# Patient Record
Sex: Female | Born: 2000 | Race: White | Hispanic: No | Smoking: Never smoker
Health system: Southern US, Community
[De-identification: ages and names within clinical notes are randomized; demographics above are authoritative.]

## PROBLEM LIST (undated history)

## (undated) ENCOUNTER — Inpatient Hospital Stay: Payer: Self-pay

## (undated) DIAGNOSIS — Z789 Other specified health status: Secondary | ICD-10-CM

## (undated) HISTORY — PX: NO PAST SURGERIES: SHX2092

---
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
## 2019-01-02 ENCOUNTER — Ambulatory Visit (INDEPENDENT_AMBULATORY_CARE_PROVIDER_SITE_OTHER): Payer: Medicaid Other | Admitting: Obstetrics and Gynecology

## 2019-01-02 VITALS — BP 136/81 | HR 111 | Wt 216.4 lb

## 2019-01-02 DIAGNOSIS — Z130 Encounter for screening for diseases of the blood and blood-forming organs and certain disorders involving the immune mechanism: Secondary | ICD-10-CM

## 2019-01-02 DIAGNOSIS — Z3402 Encounter for supervision of normal first pregnancy, second trimester: Secondary | ICD-10-CM | POA: Insufficient documentation

## 2019-01-02 DIAGNOSIS — Z131 Encounter for screening for diabetes mellitus: Secondary | ICD-10-CM

## 2019-01-02 LAB — POCT URINALYSIS DIPSTICK OB
Bilirubin, UA: NEGATIVE
Blood, UA: NEGATIVE
GLUCOSE, UA: NEGATIVE
Ketones, UA: NEGATIVE
Leukocytes, UA: NEGATIVE
Nitrite, UA: NEGATIVE
Urobilinogen, UA: 0.2 E.U./dL
pH, UA: 6 (ref 5.0–8.0)

## 2019-01-02 NOTE — Progress Notes (Signed)
ROB: Doing well, no complaints. Discussed birth place tours and classes. For 28 week labs next visit. RRTC in 4 weeks.

## 2019-01-02 NOTE — Progress Notes (Signed)
   ROB-PT stated that she is doing well no complaints.    

## 2019-01-19 ENCOUNTER — Other Ambulatory Visit: Payer: Self-pay

## 2019-01-19 ENCOUNTER — Observation Stay
Admission: EM | Admit: 2019-01-19 | Discharge: 2019-01-19 | Disposition: A | Discharge status: Home / Self Care | Payer: Medicaid Other | Attending: Obstetrics and Gynecology | Admitting: Obstetrics and Gynecology

## 2019-01-19 DIAGNOSIS — Z3A26 26 weeks gestation of pregnancy: Secondary | ICD-10-CM | POA: Diagnosis not present

## 2019-01-19 DIAGNOSIS — O368120 Decreased fetal movements, second trimester, not applicable or unspecified: Secondary | ICD-10-CM | POA: Diagnosis present

## 2019-01-19 DIAGNOSIS — O368190 Decreased fetal movements, unspecified trimester, not applicable or unspecified: Secondary | ICD-10-CM | POA: Diagnosis present

## 2019-01-19 NOTE — Discharge Instructions (Signed)

## 2019-01-19 NOTE — Progress Notes (Signed)
Pr here with c/o no fetal movement since lunchtime 01/18/19, has tried eating, drinking, position changes...denies bleeding, LOF, trauma, although she reports fall 2 weeks ago with no concerns after that event. As monitors applied, I felt fetal movement immediately, palpated 4-5 movements in the first 5 minutes with good FHTs.

## 2019-01-19 NOTE — Discharge Summary (Signed)
    L&D OB Triage Note  SUBJECTIVE Jamie Vincent is a 18 y.o. G1P0 female at 1942w3d, EDD Estimated Date of Delivery: 04/24/19 who presented to triage with complaints of decreased fetal movement.  Denies contractions, denies vaginal bleeding.  OB History  Gravida Para Term Preterm AB Living  1 0 0 0 0 0  SAB TAB Ectopic Multiple Live Births  0 0 0 0 0    # Outcome Date GA Lbr Len/2nd Weight Sex Delivery Anes PTL Lv  1 Current             Medications Prior to Admission  Medication Sig Dispense Refill Last Dose  . Prenatal Vit-Fe Fumarate-FA (MULTIVITAMIN-PRENATAL) 27-0.8 MG TABS tablet Take 1 tablet by mouth daily at 12 noon.   Taking     OBJECTIVE  Nursing Evaluation:   BP 135/69 (BP Location: Left Arm)   Pulse (!) 109   Temp 98.1 F (36.7 C) (Oral)   Resp 16   LMP 06/21/2018    Findings:   Nurse palpates fetal movement.  Patient acknowledges.  NST was performed and has been reviewed by me.  NST INTERPRETATION: Appropriate for gestational age  Mode: External Baseline Rate (A): 135 bpm Variability: Moderate Accelerations: 15 x 15 Decelerations: None, Variable     Contraction Frequency (min): none  ASSESSMENT Impression:  1.  Pregnancy:  G1P0 at 8542w3d , EDD Estimated Date of Delivery: 04/24/19 2.  NST:  Appropriate for gestational age  PLAN 1. Reassurance given 2. Discharge home with standard labor precautions given to return to L&D or call the office for problems. 3. Continue routine prenatal care.

## 2019-01-30 ENCOUNTER — Encounter: Payer: Self-pay | Admitting: Obstetrics and Gynecology

## 2019-01-30 ENCOUNTER — Other Ambulatory Visit: Payer: Medicaid Other

## 2019-01-30 ENCOUNTER — Ambulatory Visit (INDEPENDENT_AMBULATORY_CARE_PROVIDER_SITE_OTHER): Payer: Medicaid Other | Admitting: Obstetrics and Gynecology

## 2019-01-30 VITALS — BP 112/78 | HR 102 | Ht 63.0 in | Wt 217.4 lb

## 2019-01-30 DIAGNOSIS — Z23 Encounter for immunization: Secondary | ICD-10-CM | POA: Diagnosis not present

## 2019-01-30 DIAGNOSIS — Z3402 Encounter for supervision of normal first pregnancy, second trimester: Secondary | ICD-10-CM

## 2019-01-30 DIAGNOSIS — Z130 Encounter for screening for diseases of the blood and blood-forming organs and certain disorders involving the immune mechanism: Secondary | ICD-10-CM

## 2019-01-30 DIAGNOSIS — Z3401 Encounter for supervision of normal first pregnancy, first trimester: Secondary | ICD-10-CM

## 2019-01-30 DIAGNOSIS — Z131 Encounter for screening for diabetes mellitus: Secondary | ICD-10-CM

## 2019-01-30 LAB — POCT URINALYSIS DIPSTICK OB
Bilirubin, UA: NEGATIVE
Glucose, UA: NEGATIVE
Ketones, UA: NEGATIVE
Nitrite, UA: NEGATIVE
PH UA: 6.5 (ref 5.0–8.0)
RBC UA: NEGATIVE
SPEC GRAV UA: 1.02 (ref 1.010–1.025)
UROBILINOGEN UA: 0.2 U/dL

## 2019-01-30 NOTE — Progress Notes (Signed)
Patient comes in today for ROB visit. She is having CBC,RPR, and glucose test today. Tdap and blood transfusion form signed.

## 2019-01-30 NOTE — Progress Notes (Signed)
ROB: No complaints.  Reports active daily movement.  1 hour GCT today.

## 2019-01-31 LAB — CBC
Hematocrit: 30.4 % — ABNORMAL LOW (ref 34.0–46.6)
Hemoglobin: 10.3 g/dL — ABNORMAL LOW (ref 11.1–15.9)
MCH: 26.5 pg — AB (ref 26.6–33.0)
MCHC: 33.9 g/dL (ref 31.5–35.7)
MCV: 78 fL — AB (ref 79–97)
Platelets: 339 10*3/uL (ref 150–450)
RBC: 3.88 x10E6/uL (ref 3.77–5.28)
RDW: 14 % (ref 11.7–15.4)
WBC: 11.9 10*3/uL — ABNORMAL HIGH (ref 3.4–10.8)

## 2019-01-31 LAB — RPR: RPR Ser Ql: NONREACTIVE

## 2019-01-31 LAB — ANTIBODY SCREEN: Antibody Screen: NEGATIVE

## 2019-01-31 LAB — GLUCOSE, 1 HOUR GESTATIONAL: Gestational Diabetes Screen: 139 mg/dL (ref 65–139)

## 2019-02-21 NOTE — Patient Instructions (Addendum)
Third Trimester of Pregnancy  The third trimester is from week 28 through week 40 (months 7 through 9). This trimester is when your unborn baby (fetus) is growing very fast. At the end of the ninth month, the unborn baby is about 20 inches in length. It weighs about 6-10 pounds. Follow these instructions at home: Medicines  Take over-the-counter and prescription medicines only as told by your doctor. Some medicines are safe and some medicines are not safe during pregnancy.  Take a prenatal vitamin that contains at least 600 micrograms (mcg) of folic acid.  If you have trouble pooping (constipation), take medicine that will make your stool soft (stool softener) if your doctor approves. Eating and drinking   Eat regular, healthy meals.  Avoid raw meat and uncooked cheese.  If you get low calcium from the food you eat, talk to your doctor about taking a daily calcium supplement.  Eat four or five small meals rather than three large meals a day.  Avoid foods that are high in fat and sugars, such as fried and sweet foods.  To prevent constipation: ? Eat foods that are high in fiber, like fresh fruits and vegetables, whole grains, and beans. ? Drink enough fluids to keep your pee (urine) clear or pale yellow. Activity  Exercise only as told by your doctor. Stop exercising if you start to have cramps.  Avoid heavy lifting, wear low heels, and sit up straight.  Do not exercise if it is too hot, too humid, or if you are in a place of great height (high altitude).  You may continue to have sex unless your doctor tells you not to. Relieving pain and discomfort  Wear a good support bra if your breasts are tender.  Take frequent breaks and rest with your legs raised if you have leg cramps or low back pain.  Take warm water baths (sitz baths) to soothe pain or discomfort caused by hemorrhoids. Use hemorrhoid cream if your doctor approves.  If you develop puffy, bulging veins (varicose  veins) in your legs: ? Wear support hose or compression stockings as told by your doctor. ? Raise (elevate) your feet for 15 minutes, 3-4 times a day. ? Limit salt in your food. Safety  Wear your seat belt when driving.  Make a list of emergency phone numbers, including numbers for family, friends, the hospital, and police and fire departments. Preparing for your baby's arrival To prepare for the arrival of your baby:  Take prenatal classes.  Practice driving to the hospital.  Visit the hospital and tour the maternity area.  Talk to your work about taking leave once the baby comes.  Pack your hospital bag.  Prepare the baby's room.  Go to your doctor visits.  Buy a rear-facing car seat. Learn how to install it in your car. General instructions  Do not use hot tubs, steam rooms, or saunas.  Do not use any products that contain nicotine or tobacco, such as cigarettes and e-cigarettes. If you need help quitting, ask your doctor.  Do not drink alcohol.  Do not douche or use tampons or scented sanitary pads.  Do not cross your legs for long periods of time.  Do not travel for long distances unless you must. Only do so if your doctor says it is okay.  Visit your dentist if you have not gone during your pregnancy. Use a soft toothbrush to brush your teeth. Be gentle when you floss.  Avoid cat litter boxes and soil  used by cats. These carry germs that can cause birth defects in the baby and can cause a loss of your baby (miscarriage) or stillbirth.  Keep all your prenatal visits as told by your doctor. This is important. Contact a doctor if:  You are not sure if you are in labor or if your water has broken.  You are dizzy.  You have mild cramps or pressure in your lower belly.  You have a nagging pain in your belly area.  You continue to feel sick to your stomach, you throw up, or you have watery poop.  You have bad smelling fluid coming from your vagina.  You have  pain when you pee. Get help right away if:  You have a fever.  You are leaking fluid from your vagina.  You are spotting or bleeding from your vagina.  You have severe belly cramps or pain.  You lose or gain weight quickly.  You have trouble catching your breath and have chest pain.  You notice sudden or extreme puffiness (swelling) of your face, hands, ankles, feet, or legs.  You have not felt the baby move in over an hour.  You have severe headaches that do not go away with medicine.  You have trouble seeing.  You are leaking, or you are having a gush of fluid, from your vagina before you are 37 weeks.  You have regular belly spasms (contractions) before you are 37 weeks. Summary  The third trimester is from week 28 through week 40 (months 7 through 9). This time is when your unborn baby is growing very fast.  Follow your doctor's advice about medicine, food, and activity.  Get ready for the arrival of your baby by taking prenatal classes, getting all the baby items ready, preparing the baby's room, and visiting your doctor to be checked.  Get help right away if you are bleeding from your vagina, or you have chest pain and trouble catching your breath, or if you have not felt your baby move in over an hour. This information is not intended to replace advice given to you by your health care provider. Make sure you discuss any questions you have with your health care provider. Document Released: 03/01/2010 Document Revised: 01/10/2017 Document Reviewed: 01/10/2017 Elsevier Interactive Patient Education  2019 [employer].   Common Medications Safe in Pregnancy  Acne:      Constipation:  Benzoyl Peroxide     Colace  Clindamycin      Dulcolax Suppository  Topica Erythromycin     Fibercon  Salicylic Acid      Metamucil         Miralax AVOID:        Senakot   Accutane    Cough:  Retin-A       Cough Drops  Tetracycline      Phenergan w/ Codeine if  Rx  Minocycline      Robitussin (Plain & DM)  Antibiotics:     Crabs/Lice:  Ceclor       RID  Cephalosporins    AVOID:  E-Mycins      Kwell  Keflex  Macrobid/Macrodantin   Diarrhea:  Penicillin      Kao-Pectate  Zithromax      Imodium AD         PUSH FLUIDS AVOID:       Cipro     Fever:  Tetracycline      Tylenol (Regular or Extra  Minocycline  Strength)  Levaquin      Extra Strength-Do not          Exceed 8 tabs/24 hrs Caffeine:        '200mg'$ /day (equiv. To 1 cup of coffee or  approx. 3 12 oz sodas)         Gas: Cold/Hayfever:       Gas-X  Benadryl      Mylicon  Claritin       Phazyme  **Claritin-D        Chlor-Trimeton    Headaches:  Dimetapp      ASA-Free Excedrin  Drixoral-Non-Drowsy     Cold Compress  Mucinex (Guaifenasin)     Tylenol (Regular or Extra  Sudafed/Sudafed-12 Hour     Strength)  **Sudafed PE Pseudoephedrine   Tylenol Cold & Sinus     Vicks Vapor Rub  Zyrtec  **AVOID if Problems With Blood Pressure         Heartburn: Avoid lying down for at least 1 hour after meals  Aciphex      Maalox     Rash:  Milk of Magnesia     Benadryl    Mylanta       1% Hydrocortisone Cream  Pepcid  Pepcid Complete   Sleep Aids:  Prevacid      Ambien   Prilosec       Benadryl  Rolaids       Chamomile Tea  Tums (Limit 4/day)     Unisom  Zantac       Tylenol PM         Warm milk-add vanilla or  Hemorrhoids:       Sugar for taste  Anusol/Anusol H.C.  (RX: Analapram 2.5%)  Sugar Substitutes:  Hydrocortisone OTC     Ok in moderation  Preparation H      Tucks        Vaseline lotion applied to tissue with wiping    Herpes:     Throat:  Acyclovir      Oragel  Famvir  Valtrex     Vaccines:         Flu Shot Leg Cramps:       *Gardasil  Benadryl      Hepatitis A         Hepatitis B Nasal Spray:       Pneumovax  Saline Nasal Spray     Polio Booster         Tetanus Nausea:       Tuberculosis test or PPD  Vitamin B6 25 mg  TID   AVOID:    Dramamine      *Gardasil  Emetrol       Live Poliovirus  Ginger Root 250 mg QID    MMR (measles, mumps &  High Complex Carbs @ Bedtime    rebella)  Sea Bands-Accupressure    Varicella (Chickenpox)  Unisom 1/2 tab TID     *No known complications           If received before Pain:         Known pregnancy;   Darvocet       Resume series after  Lortab        Delivery  Percocet    Yeast:   Tramadol      Femstat  Tylenol 3      Gyne-lotrimin  Ultram       Monistat  Vicodin           MISC:  All Sunscreens           Hair Coloring/highlights          Insect Repellant's          (Including DEET)         Mystic Tans   Pain Relief During Labor and Delivery Many things can cause pain during labor and delivery, including:  Pressure on bones and ligaments due to the baby moving through the pelvis.  Stretching of tissues due to the baby moving through the birth canal.  Muscle tension due to anxiety or nervousness.  The uterus tightening (contracting) and relaxing to help move the baby. There are many ways to deal with the pain of labor and delivery. They include:  Taking prenatal classes. Taking these classes helps you know what to expect during your baby's birth. What you learn will increase your confidence and decrease your anxiety.  Practicing relaxation techniques or doing relaxing activities, such as: ? Focused breathing. ? Meditation. ? Visualization. ? Aroma therapy. ? Listening to your favorite music. ? Hypnosis.  Taking a warm shower or bath (hydrotherapy). This may: ? Provide comfort and relaxation. ? Lessen your perception of pain. ? Decrease the amount of pain medicine needed. ? Decrease the length of labor.  Getting a massage or counterpressure on your back.  Applying warm packs or ice packs.  Changing positions often, moving around, or using a birthing ball.  Getting: ? Pain medicine through an IV or injection into a muscle. ? Pain  medicine inserted into your spinal column. ? Injections of sterile water just under the skin on your lower back (intradermal injections). ? Laughing gas (nitrous oxide). Discuss your pain control options with your health care provider during your prenatal visits. Explore the options offered by your hospital or birth center. What kinds of medicine are available? There are two kinds of medicines that can be used to relieve pain during labor and delivery:  Analgesics. These medicines decrease pain without causing you to lose feeling or the ability to move your muscles.  Anesthetics. These medicines block feeling in the body and can decrease your ability to move freely. Both of these kinds of medicine can cause minor side effects, such as nausea, trouble concentrating, and sleepiness. They can also decrease the baby's heart rate before birth and affect the baby's breathing rate after birth. For this reason, health care providers are careful about when and how much medicine is given. What are specific medicines and procedures that provide pain relief? Local Anesthetics Local anesthetics are used to numb a small area of the body. They may be used along with another kind of anesthetic or used to numb the nerves of the vagina, cervix, and perineum during the second stage of labor. General Anesthetics General anesthetics cause you to lose consciousness so you do not feel pain. They are usually only used for an emergency cesarean delivery. General anesthetics are given through an IV tube and a mask. Pudendal Block A pudendal block is a form of local anesthetic. It may be used to relieve the pain associated with pushing or stretching of the perineum at the time of delivery or to further numb the perineum. A pudendal block is done by injecting numbing medicine through the vaginal wall into a nerve in the pelvis. Epidural Analgesia Epidural analgesia is given through a flexible IV catheter that is inserted  into the lower back. Numbing medicine is delivered continuously to the area near your spinal column nerves (epidural space).  After having this type of analgesia, you may be able to move your legs but you most likely will not be able to walk. Depending on the amount of medicine given, you may lose all feeling in the lower half of your body, or you may retain some level of sensation, including the urge to push. Epidural analgesia can be used to provide pain relief for a vaginal birth. Spinal Block A spinal block is similar to epidural analgesia, but the medicine is injected into the spinal fluid instead of the epidural space. A spinal block is only given once. It starts to relieve pain quickly, but the pain relief lasts only 1-6 hours. Spinal blocks can be used for cesarean deliveries. Combined Spinal-Epidural (CSE) Block A CSE block combines the effects of a spinal block and epidural analgesia. The spinal block works quickly to block all pain. The epidural analgesia provides continuous pain relief, even after the effects of the spinal block have worn off. This information is not intended to replace advice given to you by your health care provider. Make sure you discuss any questions you have with your health care provider. Document Released: 03/23/2009 Document Revised: 05/13/2016 Document Reviewed: 04/27/2016 Elsevier Interactive Patient Education  2019 [employer].

## 2019-02-22 ENCOUNTER — Encounter: Payer: Self-pay | Admitting: Obstetrics and Gynecology

## 2019-02-22 ENCOUNTER — Ambulatory Visit (INDEPENDENT_AMBULATORY_CARE_PROVIDER_SITE_OTHER): Payer: Medicaid Other | Admitting: Obstetrics and Gynecology

## 2019-02-22 VITALS — BP 139/80 | HR 106 | Wt 219.8 lb

## 2019-02-22 DIAGNOSIS — Z3403 Encounter for supervision of normal first pregnancy, third trimester: Secondary | ICD-10-CM

## 2019-02-22 LAB — POCT URINALYSIS DIPSTICK OB
BILIRUBIN UA: NEGATIVE
Blood, UA: NEGATIVE
Glucose, UA: NEGATIVE
Ketones, UA: NEGATIVE
Leukocytes, UA: NEGATIVE
Nitrite, UA: NEGATIVE
POC,PROTEIN,UA: NEGATIVE
Spec Grav, UA: 1.01 (ref 1.010–1.025)
Urobilinogen, UA: 0.2 E.U./dL
pH, UA: 7.5 (ref 5.0–8.0)

## 2019-02-22 NOTE — Progress Notes (Signed)
ROB-Pt present for prenatal care. Pt stated that she was doing well no complaints.

## 2019-02-22 NOTE — Progress Notes (Signed)
ROB: Doing well, no complaints. Discussed pain management in labor. Given handout on birthplace classes and doula program.  RTC in 2 weeks.

## 2019-03-12 ENCOUNTER — Telehealth: Payer: Self-pay | Admitting: Surgical

## 2019-03-12 NOTE — Telephone Encounter (Signed)
Coronavirus (COVID-19) Are you at risk?  Are you at risk for the Coronavirus (COVID-19)?  To be considered HIGH RISK for Coronavirus (COVID-19), you have to meet the following criteria:  . Traveled to [place], [place], [place], [place] or [place]; or in the [place] to [place], [place], [place], or [place]; and have fever, cough, and shortness of breath within the last 2 weeks of travel OR . Been in close contact with a person diagnosed with COVID-19 within the last 2 weeks and have fever, cough, and shortness of breath . IF YOU DO NOT MEET THESE CRITERIA, YOU ARE CONSIDERED LOW RISK FOR COVID-19.  What to do if you are HIGH RISK for COVID-19?  . If you are having a medical emergency, call 911. . Seek medical care right away. Before you go to a doctor's office, urgent care or emergency department, call ahead and tell them about your recent travel, contact with someone diagnosed with COVID-19, and your symptoms. You should receive instructions from your physician's office regarding next steps of care.  . When you arrive at healthcare provider, tell the healthcare staff immediately you have returned from visiting [place], [place], [place], [place] or [place]; or traveled in the [place] to [place], [place], [place], or [place]; in the last two weeks or you have been in close contact with a person diagnosed with COVID-19 in the last 2 weeks.   . Tell the health care staff about your symptoms: fever, cough and shortness of breath. . After you have been seen by a medical provider, you will be either: o Tested for (COVID-19) and discharged home on quarantine except to seek medical care if symptoms worsen, and asked to  - Stay home and avoid contact with others until you get your results (4-5 days)  - Avoid travel on public transportation if possible (such as bus, train, or airplane) or o Sent to the Emergency Department by EMS for evaluation, COVID-19 testing, and possible  admission depending on your condition and test results.  What to do if you are LOW RISK for COVID-19?  Reduce your risk of any infection by using the same precautions used for avoiding the common cold or flu:  . Wash your hands often with soap and warm water for at least 20 seconds.  If soap and water are not readily available, use an alcohol-based hand sanitizer with at least 60% alcohol.  . If coughing or sneezing, cover your mouth and nose by coughing or sneezing into the elbow areas of your shirt or coat, into a tissue or into your sleeve (not your hands). . Avoid shaking hands with others and consider head nods or verbal greetings only. . Avoid touching your eyes, nose, or mouth with unwashed hands.  . Avoid close contact with people who are sick. . Avoid places or events with large numbers of people in one location, like concerts or sporting events. . Carefully consider travel plans you have or are making. . If you are planning any travel outside or inside the [place], visit the CDC's Travelers' Health webpage for the latest health notices. . If you have some symptoms but not all symptoms, continue to monitor at home and seek medical attention if your symptoms worsen. . If you are having a medical emergency, call 911.   ADDITIONAL HEALTHCARE OPTIONS FOR PATIENTS  [facility] / e-Visit: [email or web address]         [facility]: [phone]  [facility]: [phone]                   [facility]: [phone]    Patient has been prescreened. JW   

## 2019-03-13 ENCOUNTER — Encounter: Payer: Self-pay | Admitting: Obstetrics and Gynecology

## 2019-03-13 ENCOUNTER — Ambulatory Visit (INDEPENDENT_AMBULATORY_CARE_PROVIDER_SITE_OTHER): Payer: Medicaid Other | Admitting: Obstetrics and Gynecology

## 2019-03-13 ENCOUNTER — Other Ambulatory Visit: Payer: Self-pay

## 2019-03-13 VITALS — BP 124/79 | HR 99 | Ht 63.0 in | Wt 219.0 lb

## 2019-03-13 DIAGNOSIS — Z3403 Encounter for supervision of normal first pregnancy, third trimester: Secondary | ICD-10-CM

## 2019-03-13 LAB — POCT URINALYSIS DIPSTICK OB
Bilirubin, UA: NEGATIVE
Blood, UA: NEGATIVE
Glucose, UA: NEGATIVE
Ketones, UA: NEGATIVE
Nitrite, UA: NEGATIVE
Spec Grav, UA: 1.01 (ref 1.010–1.025)
Urobilinogen, UA: 0.2 E.U./dL
pH, UA: 6.5 (ref 5.0–8.0)

## 2019-03-13 NOTE — Progress Notes (Signed)
Patient comes in today for ROB visit. Patients states that she had some back pain last night off and on through the night. It is better today.

## 2019-03-13 NOTE — Progress Notes (Signed)
ROB: No complaints.  Patient still working at a daycare-advised to stop working.  COVID-19 discussed in detail.  Cultures next visit.

## 2019-03-13 NOTE — Patient Instructions (Signed)
Q: Why are visitor restrictions different for maternity care areas? [place] is restricting visitors for the duration of the patient's hospitalization. The birth of a child involves the mother, considered the patient, and a birthing partner. These are unprecedented times and we are making the exception to allow a birthing partner to be a part of the patient unit. No other guests will be allowed in our [facility] at [facility] and at [facility].  Q: Are credentialed doulas allowed to support their existing patients? We acknowledge the value these doula partnerships offer our care teams and many birthing families in our communities. Each laboring mother is allowed one birthing partner of the patient's choosing for her entire hospitalization.  Q: Are visitor restrictions different for hospitalized children? Pediatric patients (infants and children under 17 years of age), such as those in the Children's Unit, Pediatric ICU and NICU, will be allowed two visitors (parents or legal guardians)  Q: Are pregnant women at an increased risk for COVID-19? The American College of Obstetricians and Gynecologists (ACOG) is monitoring closely the coronavirus pandemic. With the limited information available, data does not indicate pregnant women are at an increased risk. However, pregnant women are known to be at greater risk for respiratory infections like flu. With that in mind, expectant mothers are considered an at-risk population for COVID-19, according to ACOG.  Q: Are newborns at an increased risk for COVID-19? A limited sample of COVID-19 data with newborns indicates the virus is not transferred to the infant during pregnancy. However, postpartum separation is recommended by the Centers for Disease Control (CDC). As a result [facility] recommends and strongly encourages temporary separation of moms and babies who test positive for COVID-19 or are awaiting results to rule out  COVID-19 based on CDC guidelines.  Q: If you have a suspected case of COVID-19, is the NICU couplet care room an option? No. If either patient is considered at-risk for having COVID-19, the [facility] Center at [facility] will not use the NICU couplet care rooms for that family.  Q: [place] is urging that elective procedures be postponed. What is considered elective for women's and children's service line? NOT ELECTIVE: Obstetric procedures, even those with an element of choice on timing, are not considered elective. Circumcisions are considered elective procedures, however, these do not deplete blood products and other resources, which is the spirit in which the COVID-19 postponement of elective procedures was intended. Therefore, circumcisions will be allowed.  ELECTIVE: Postpartum tubal ligations are considered elective and should be postponed.  [facility] supports as much as possible the medical care team working with the patient's individual needs to address timing during these unprecedented times. We seek the support of our medical care team in preserving needed resources throughout our crisis response to COVID-19.  Q: How does COVID-19 impact breastfeeding? Breastmilk is safe for your baby - even if the mother has tested positive for COVID-19. We suggest the mother pump her milk and have a healthy family member feed the baby to protect the baby from getting the virus.  If a COVID-19+ mother decides to breastfeed after discharge, we suggest proper protective equipment be worn and hand hygiene be performed before and after feeding the infant.  Q: Should we urge patients to avoid baby showers and large gatherings? Yes. As has been recommended for all citizens in our communities, gatherings of 10 or more should be avoided - pregnant or not. Seek creative options   for "hosting" baby showers through electronic means that honor the request for social distancing during this  time of heightened awareness.  Q: Should patients miss their prenatal appointments? No. Prenatal visits are NOT elective. While we want to limit contact and exposure, prenatal care is vital right now. Contact your physician's office if you have concerns about your visits. We are limiting outpatient office visits to the patient and one guest in order to reduce the potential for exposure.  Q: What if a pregnant woman feels sick? Should she miss her prenatal visit then? A pregnant woman experiencing coronavirus-like symptoms (i.e., cough, fever, difficulty breathing, shortness of breath, gastrointestinal issues) should contact her pregnancy care provider by phone. Her medical professional can best determine whether she should use a video visit or possibly go to a collection site to be tested for COVID-19. Contacting her primary care provider or her pregnancy care provider is her first step.  Q: What can I do about childbirth education? All the classes are cancelled. The [facility] Centers will offer online learning to support mothers on their journey.  We currently offer Understanding Childbirth, Understanding Breastfeeding and Understanding Newborn Care as an online class.  Please visit our website, [email or web address], to register for an online class.  Q: How can I keep from getting COVID-19? Together, we can reduce the risk of exposure to the virus and help you and your family remain healthy and safe. One of the best ways to protect yourself is to wash your hands frequently using soap and water. Also, you should avoid touching your eyes, nose and mouth with unwashed hands, avoid physical contact with others and practice social distancing.   

## 2019-03-27 ENCOUNTER — Telehealth: Payer: Self-pay | Admitting: Surgical

## 2019-03-27 NOTE — Telephone Encounter (Signed)
Coronavirus (COVID-19) Are you at risk?  Are you at risk for the Coronavirus (COVID-19)?  To be considered HIGH RISK for Coronavirus (COVID-19), you have to meet the following criteria:  . Traveled to [place], [place], [place], [place] or [place]; or in the [place] to [place], [place], [place], or [place]; and have fever, cough, and shortness of breath within the last 2 weeks of travel OR . Been in close contact with a person diagnosed with COVID-19 within the last 2 weeks and have fever, cough, and shortness of breath . IF YOU DO NOT MEET THESE CRITERIA, YOU ARE CONSIDERED LOW RISK FOR COVID-19.  What to do if you are HIGH RISK for COVID-19?  . If you are having a medical emergency, call 911. . Seek medical care right away. Before you go to a doctor's office, urgent care or emergency department, call ahead and tell them about your recent travel, contact with someone diagnosed with COVID-19, and your symptoms. You should receive instructions from your physician's office regarding next steps of care.  . When you arrive at healthcare provider, tell the healthcare staff immediately you have returned from visiting [place], [place], [place], [place] or [place]; or traveled in the [place] to [place], [place], [place], or [place]; in the last two weeks or you have been in close contact with a person diagnosed with COVID-19 in the last 2 weeks.   . Tell the health care staff about your symptoms: fever, cough and shortness of breath. . After you have been seen by a medical provider, you will be either: o Tested for (COVID-19) and discharged home on quarantine except to seek medical care if symptoms worsen, and asked to  - Stay home and avoid contact with others until you get your results (4-5 days)  - Avoid travel on public transportation if possible (such as bus, train, or airplane) or o Sent to the Emergency Department by EMS for evaluation, COVID-19 testing, and possible  admission depending on your condition and test results.  What to do if you are LOW RISK for COVID-19?  Reduce your risk of any infection by using the same precautions used for avoiding the common cold or flu:  . Wash your hands often with soap and warm water for at least 20 seconds.  If soap and water are not readily available, use an alcohol-based hand sanitizer with at least 60% alcohol.  . If coughing or sneezing, cover your mouth and nose by coughing or sneezing into the elbow areas of your shirt or coat, into a tissue or into your sleeve (not your hands). . Avoid shaking hands with others and consider head nods or verbal greetings only. . Avoid touching your eyes, nose, or mouth with unwashed hands.  . Avoid close contact with people who are sick. . Avoid places or events with large numbers of people in one location, like concerts or sporting events. . Carefully consider travel plans you have or are making. . If you are planning any travel outside or inside the [place], visit the CDC's Travelers' Health webpage for the latest health notices. . If you have some symptoms but not all symptoms, continue to monitor at home and seek medical attention if your symptoms worsen. . If you are having a medical emergency, call 911.   ADDITIONAL HEALTHCARE OPTIONS FOR PATIENTS  [facility] / e-Visit: [email or web address]         [facility]: [phone]  [facility]: [phone]                   [facility]: [phone]    Patient has been screened with no symptoms. JW 

## 2019-03-28 ENCOUNTER — Encounter: Payer: Self-pay | Admitting: Obstetrics and Gynecology

## 2019-03-28 ENCOUNTER — Other Ambulatory Visit: Payer: Self-pay

## 2019-03-28 ENCOUNTER — Ambulatory Visit (INDEPENDENT_AMBULATORY_CARE_PROVIDER_SITE_OTHER): Payer: Medicaid Other | Admitting: Obstetrics and Gynecology

## 2019-03-28 VITALS — BP 112/76 | HR 103 | Wt 221.4 lb

## 2019-03-28 DIAGNOSIS — O479 False labor, unspecified: Secondary | ICD-10-CM

## 2019-03-28 DIAGNOSIS — Z3403 Encounter for supervision of normal first pregnancy, third trimester: Secondary | ICD-10-CM

## 2019-03-28 LAB — POCT URINALYSIS DIPSTICK OB
Bilirubin, UA: NEGATIVE
Blood, UA: NEGATIVE
Glucose, UA: NEGATIVE
Ketones, UA: NEGATIVE
Nitrite, UA: NEGATIVE
POC,PROTEIN,UA: NEGATIVE
Spec Grav, UA: 1.015 (ref 1.010–1.025)
Urobilinogen, UA: 0.2 E.U./dL
pH, UA: 6.5 (ref 5.0–8.0)

## 2019-03-28 NOTE — Progress Notes (Signed)
ROB: Patient doing well, no complaints. Does note some [employer].  Discussed labor precautions. Informed of virtual breastfeeding classes, patient willing to participate. 36 week labs done today. Bedside scan for presentation done, vertex. RTC in 2 weeks.

## 2019-03-28 NOTE — Patient Instructions (Signed)

## 2019-03-28 NOTE — Progress Notes (Signed)
ROB-Pt present today for prenatal care and 36 weeks cultures. Pt stated that she was doing well. Pt stated Fetal movement present, light pressure in the abd area. Pt stated braxton hicks contractions off and on and no vaginal bleeding.

## 2019-03-30 LAB — STREP GP B NAA: Strep Gp B NAA: NEGATIVE

## 2019-03-31 LAB — GC/CHLAMYDIA PROBE AMP
Chlamydia trachomatis, NAA: NEGATIVE
Neisseria Gonorrhoeae by PCR: NEGATIVE

## 2019-04-09 ENCOUNTER — Telehealth: Payer: Self-pay

## 2019-04-09 NOTE — Telephone Encounter (Signed)
Pt called to do prescreening for COVID-19 before her visit no answer LM via voicemail to call the office to do prescreening.

## 2019-04-10 ENCOUNTER — Ambulatory Visit (INDEPENDENT_AMBULATORY_CARE_PROVIDER_SITE_OTHER): Payer: Medicaid Other | Admitting: Obstetrics and Gynecology

## 2019-04-10 ENCOUNTER — Other Ambulatory Visit: Payer: Self-pay

## 2019-04-10 ENCOUNTER — Encounter: Payer: Self-pay | Admitting: Obstetrics and Gynecology

## 2019-04-10 VITALS — BP 120/87 | HR 107 | Ht 63.0 in | Wt 225.7 lb

## 2019-04-10 DIAGNOSIS — Z3403 Encounter for supervision of normal first pregnancy, third trimester: Secondary | ICD-10-CM

## 2019-04-10 LAB — POCT URINALYSIS DIPSTICK OB
Bilirubin, UA: NEGATIVE
Blood, UA: NEGATIVE
Glucose, UA: NEGATIVE
Leukocytes, UA: NEGATIVE
Nitrite, UA: NEGATIVE
POC,PROTEIN,UA: NEGATIVE
Spec Grav, UA: 1.01 (ref 1.010–1.025)
Urobilinogen, UA: 0.2 E.U./dL
pH, UA: 7 (ref 5.0–8.0)

## 2019-04-10 NOTE — Progress Notes (Signed)
Patient comes in today for ROB visit. She has no concerns today.  

## 2019-04-10 NOTE — Patient Instructions (Signed)
Q: Why are visitor restrictions different for maternity care areas? [place] is restricting visitors for the duration of the patient's hospitalization. The birth of a child involves the mother, considered the patient, and a birthing partner. These are unprecedented times and we are making the exception to allow a birthing partner to be a part of the patient unit. No other guests will be allowed in our [facility] Center at [facility] and at [facility].  Q: Are credentialed doulas allowed to support their existing patients? We acknowledge the value these doula partnerships offer our care teams and many birthing families in our communities. Each laboring mother is allowed one birthing partner of the patient's choosing for her entire hospitalization.  Q: Are visitor restrictions different for hospitalized children? Pediatric patients (infants and children under 17 years of age), such as those in the Children's Unit, Pediatric ICU and NICU, will be allowed two visitors (parents or legal guardians)  Q: Are pregnant women at an increased risk for COVID-19? The American College of Obstetricians and Gynecologists (ACOG) is monitoring closely the coronavirus pandemic. With the limited information available, data does not indicate pregnant women are at an increased risk. However, pregnant women are known to be at greater risk for respiratory infections like flu. With that in mind, expectant mothers are considered an at-risk population for COVID-19, according to ACOG.  Q: Are newborns at an increased risk for COVID-19? A limited sample of COVID-19 data with newborns indicates the virus is not transferred to the infant during pregnancy. However, postpartum separation is recommended by the Centers for Disease Control (CDC). As a result [facility] recommends and strongly encourages temporary separation of moms and babies who test positive for COVID-19 or are awaiting results to rule out  COVID-19 based on CDC guidelines.  Q: If you have a suspected case of COVID-19, is the NICU couplet care room an option? No. If either patient is considered at-risk for having COVID-19, the [facility] Center at [facility] will not use the NICU couplet care rooms for that family.  Q: [place] is urging that elective procedures be postponed. What is considered elective for women's and children's service line? NOT ELECTIVE: Obstetric procedures, even those with an element of choice on timing, are not considered elective. Circumcisions are considered elective procedures, however, these do not deplete blood products and other resources, which is the spirit in which the COVID-19 postponement of elective procedures was intended. Therefore, circumcisions will be allowed.  ELECTIVE: Postpartum tubal ligations are considered elective and should be postponed.  [facility] supports as much as possible the medical care team working with the patient's individual needs to address timing during these unprecedented times. We seek the support of our medical care team in preserving needed resources throughout our crisis response to COVID-19.  Q: How does COVID-19 impact breastfeeding? Breastmilk is safe for your baby - even if the mother has tested positive for COVID-19. We suggest the mother pump her milk and have a healthy family member feed the baby to protect the baby from getting the virus.  If a COVID-19+ mother decides to breastfeed after discharge, we suggest proper protective equipment be worn and hand hygiene be performed before and after feeding the infant.  Q: Should we urge patients to avoid baby showers and large gatherings? Yes. As has been recommended for all citizens in our communities, gatherings of 10 or more should be avoided - pregnant or not. Seek creative options   for "hosting" baby showers through electronic means that honor the request for social distancing during this  time of heightened awareness.  Q: Should patients miss their prenatal appointments? No. Prenatal visits are NOT elective. While we want to limit contact and exposure, prenatal care is vital right now. Contact your physician's office if you have concerns about your visits. We are limiting outpatient office visits to the patient and one guest in order to reduce the potential for exposure.  Q: What if a pregnant woman feels sick? Should she miss her prenatal visit then? A pregnant woman experiencing coronavirus-like symptoms (i.e., cough, fever, difficulty breathing, shortness of breath, gastrointestinal issues) should contact her pregnancy care provider by phone. Her medical professional can best determine whether she should use a video visit or possibly go to a collection site to be tested for COVID-19. Contacting her primary care provider or her pregnancy care provider is her first step.  Q: What can I do about childbirth education? All the classes are cancelled. The [facility] Centers will offer online learning to support mothers on their journey.  We currently offer Understanding Childbirth, Understanding Breastfeeding and Understanding Newborn Care as an online class.  Please visit our website, [email or web address], to register for an online class.  Q: How can I keep from getting COVID-19? Together, we can reduce the risk of exposure to the virus and help you and your family remain healthy and safe. One of the best ways to protect yourself is to wash your hands frequently using soap and water. Also, you should avoid touching your eyes, nose and mouth with unwashed hands, avoid physical contact with others and practice social distancing.   

## 2019-04-10 NOTE — Progress Notes (Signed)
ROB: Patient denies contractions.  Signs and symptoms of labor discussed.  Induction for postdates discussed if necessary.  COVID-19 discussed.

## 2019-04-19 ENCOUNTER — Other Ambulatory Visit: Payer: Self-pay

## 2019-04-19 ENCOUNTER — Encounter: Payer: Self-pay | Admitting: Obstetrics and Gynecology

## 2019-04-19 ENCOUNTER — Ambulatory Visit (INDEPENDENT_AMBULATORY_CARE_PROVIDER_SITE_OTHER): Payer: Medicaid Other | Admitting: Obstetrics and Gynecology

## 2019-04-19 VITALS — BP 119/81 | HR 85 | Wt 225.4 lb

## 2019-04-19 DIAGNOSIS — Z3403 Encounter for supervision of normal first pregnancy, third trimester: Secondary | ICD-10-CM

## 2019-04-19 DIAGNOSIS — O480 Post-term pregnancy: Secondary | ICD-10-CM

## 2019-04-19 LAB — POCT URINALYSIS DIPSTICK OB
Bilirubin, UA: NEGATIVE
Blood, UA: NEGATIVE
Glucose, UA: NEGATIVE
Ketones, UA: NEGATIVE
Nitrite, UA: NEGATIVE
Spec Grav, UA: 1.015 (ref 1.010–1.025)
Urobilinogen, UA: 0.2 E.U./dL
pH, UA: 7.5 (ref 5.0–8.0)

## 2019-04-19 NOTE — Progress Notes (Signed)
ROB: Doing well. Notes she lost mucus plug on Wednesday.  Has had intermittent off and on contractions for 1 day after but have subsided.  Given labor precautions. RTC in 1 week if undelivered, for BPP and AFI for post-dates pregnancy. Will need to schedule for IOL at next visit.

## 2019-04-19 NOTE — Patient Instructions (Signed)

## 2019-04-19 NOTE — Progress Notes (Signed)
ROB-Pt present today for prenatal care. Pt stated that she lost mucus plug on Wednesday and noticed contractions all day. Pt stated that she is having pain and pressure in the lower abd area. No other concerns.

## 2019-04-22 ENCOUNTER — Other Ambulatory Visit: Payer: Self-pay

## 2019-04-22 ENCOUNTER — Observation Stay
Admission: EM | Admit: 2019-04-22 | Discharge: 2019-04-22 | Disposition: A | Discharge status: Home / Self Care | Payer: Medicaid Other | Attending: Obstetrics and Gynecology | Admitting: Obstetrics and Gynecology

## 2019-04-22 DIAGNOSIS — Z0371 Encounter for suspected problem with amniotic cavity and membrane ruled out: Secondary | ICD-10-CM | POA: Diagnosis not present

## 2019-04-22 DIAGNOSIS — Z3A39 39 weeks gestation of pregnancy: Secondary | ICD-10-CM | POA: Insufficient documentation

## 2019-04-22 DIAGNOSIS — O471 False labor at or after 37 completed weeks of gestation: Secondary | ICD-10-CM

## 2019-04-22 HISTORY — DX: Other specified health status: Z78.9

## 2019-04-22 LAB — URINALYSIS, ROUTINE W REFLEX MICROSCOPIC
Bacteria, UA: NONE SEEN
Bilirubin Urine: NEGATIVE
Glucose, UA: NEGATIVE mg/dL
Hgb urine dipstick: NEGATIVE
Ketones, ur: NEGATIVE mg/dL
Nitrite: NEGATIVE
Protein, ur: NEGATIVE mg/dL
Specific Gravity, Urine: 1.023 (ref 1.005–1.030)
pH: 7 (ref 5.0–8.0)

## 2019-04-22 NOTE — OB Triage Note (Signed)
Pt is a 18y.o G1p0 at [redacted]w[redacted]d that presents from the ED with c/o ctx. Pt states ctx started around 0800 this morning and are rated 8/10 on pain scale and pt states they were every 6 minutes. Pt states she has had LOF since 0530 this morning but could not tell if it was urine. Pt denies VB and states positive FM. Monitors applied and initial FHT 135.

## 2019-04-22 NOTE — OB Triage Note (Signed)
Md notified of patient arrival and uterine activity and vaginal exam. Provider gave verbal order to D/C pt.  Discharge instructions reviewed with patient and pt has no further questions at this time. Pt instructed on when to call or return to hospital for evaluation and verbalized understanding of instructions. Pt discharged in stable condition.

## 2019-04-23 NOTE — Discharge Summary (Signed)
    L&D OB Triage Note  SUBJECTIVE Jamie Vincent is a 18 y.o. G1P0 female at [redacted]w[redacted]d, EDD Estimated Date of Delivery: 04/24/19 who presented to triage with complaints of LOF and contractions.   OB History  Gravida Para Term Preterm AB Living  1 0 0 0 0 0  SAB TAB Ectopic Multiple Live Births  0 0 0 0 0    # Outcome Date GA Lbr Len/2nd Weight Sex Delivery Anes PTL Lv  1 Current             No medications prior to admission.     OBJECTIVE  Nursing Evaluation:   BP 137/79   Pulse 97   Temp 98.1 F (36.7 C)   Resp 18   Ht 5\' 3"  (1.6 m)   Wt 102.1 kg   LMP 06/21/2018   BMI 39.86 kg/m    Findings:   No fluid noted.  Rare contractions.  Pt not in labor and no ROM.  NST was performed and has been reviewed by me.  NST INTERPRETATION: Category I  Mode: External Baseline Rate (A): 135 bpm Variability: Moderate Accelerations: 15 x 15 Decelerations: None     Contraction Frequency (min): none  ASSESSMENT Impression:  1.  Pregnancy:  G1P0 at [redacted]w[redacted]d , EDD Estimated Date of Delivery: 04/24/19 2.  NST:  Category I  PLAN 1. Reassurance given 2. Discharge home with standard labor precautions given to return to L&D or call the office for problems. 3. Continue routine prenatal care.

## 2019-04-24 ENCOUNTER — Other Ambulatory Visit: Payer: Medicaid Other

## 2019-04-24 ENCOUNTER — Encounter: Payer: Medicaid Other | Admitting: Obstetrics and Gynecology

## 2019-04-24 ENCOUNTER — Inpatient Hospital Stay: Payer: Medicaid Other | Admitting: Anesthesiology

## 2019-04-24 ENCOUNTER — Other Ambulatory Visit: Payer: Self-pay

## 2019-04-24 ENCOUNTER — Inpatient Hospital Stay
Admission: EM | Admit: 2019-04-24 | Discharge: 2019-04-26 | DRG: 807 | Disposition: A | Discharge status: Home / Self Care | Payer: Medicaid Other | Attending: Obstetrics and Gynecology | Admitting: Obstetrics and Gynecology

## 2019-04-24 DIAGNOSIS — E668 Other obesity: Secondary | ICD-10-CM | POA: Diagnosis present

## 2019-04-24 DIAGNOSIS — O9902 Anemia complicating childbirth: Principal | ICD-10-CM | POA: Diagnosis present

## 2019-04-24 DIAGNOSIS — O99214 Obesity complicating childbirth: Secondary | ICD-10-CM | POA: Diagnosis present

## 2019-04-24 DIAGNOSIS — O480 Post-term pregnancy: Secondary | ICD-10-CM | POA: Diagnosis not present

## 2019-04-24 DIAGNOSIS — O99210 Obesity complicating pregnancy, unspecified trimester: Secondary | ICD-10-CM

## 2019-04-24 DIAGNOSIS — E669 Obesity, unspecified: Secondary | ICD-10-CM | POA: Diagnosis present

## 2019-04-24 DIAGNOSIS — D649 Anemia, unspecified: Secondary | ICD-10-CM | POA: Diagnosis present

## 2019-04-24 DIAGNOSIS — Z3A40 40 weeks gestation of pregnancy: Secondary | ICD-10-CM | POA: Diagnosis not present

## 2019-04-24 DIAGNOSIS — O26893 Other specified pregnancy related conditions, third trimester: Secondary | ICD-10-CM | POA: Diagnosis present

## 2019-04-24 DIAGNOSIS — O99013 Anemia complicating pregnancy, third trimester: Secondary | ICD-10-CM | POA: Diagnosis present

## 2019-04-24 LAB — CBC
HCT: 29.9 % — ABNORMAL LOW (ref 36.0–46.0)
Hemoglobin: 9.4 g/dL — ABNORMAL LOW (ref 12.0–15.0)
MCH: 23.1 pg — ABNORMAL LOW (ref 26.0–34.0)
MCHC: 31.4 g/dL (ref 30.0–36.0)
MCV: 73.5 fL — ABNORMAL LOW (ref 80.0–100.0)
Platelets: 306 10*3/uL (ref 150–400)
RBC: 4.07 MIL/uL (ref 3.87–5.11)
RDW: 17.2 % — ABNORMAL HIGH (ref 11.5–15.5)
WBC: 13.6 10*3/uL — ABNORMAL HIGH (ref 4.0–10.5)
nRBC: 0.1 % (ref 0.0–0.2)

## 2019-04-24 LAB — RAPID HIV SCREEN (HIV 1/2 AB+AG)
HIV 1/2 Antibodies: NONREACTIVE
HIV-1 P24 Antigen - HIV24: NONREACTIVE

## 2019-04-24 LAB — CHLAMYDIA/NGC RT PCR (ARMC ONLY): N gonorrhoeae: NOT DETECTED

## 2019-04-24 LAB — TYPE AND SCREEN
ABO/RH(D): A POS
Antibody Screen: NEGATIVE

## 2019-04-24 LAB — CHLAMYDIA/NGC RT PCR (ARMC ONLY)??????????: Chlamydia Tr: NOT DETECTED

## 2019-04-24 MED ORDER — DIBUCAINE (PERIANAL) 1 % EX OINT: 1.0000 "application " | TOPICAL_OINTMENT | CUTANEOUS | Status: DC | PRN

## 2019-04-24 MED ORDER — SIMETHICONE 80 MG PO CHEW: 80.0000 mg | CHEWABLE_TABLET | ORAL | Status: DC | PRN

## 2019-04-24 MED ORDER — ZOLPIDEM TARTRATE 5 MG PO TABS: 5.0000 mg | ORAL_TABLET | Freq: Every evening | ORAL | Status: DC | PRN

## 2019-04-24 MED ORDER — BUTORPHANOL TARTRATE 1 MG/ML IJ SOLN: 1.0000 mg | INTRAMUSCULAR | Status: DC | PRN

## 2019-04-24 MED ORDER — ONDANSETRON HCL 4 MG/2ML IJ SOLN: 4.0000 mg | INTRAMUSCULAR | Status: DC | PRN

## 2019-04-24 MED ORDER — OXYTOCIN 40 UNITS IN NORMAL SALINE INFUSION - SIMPLE MED
2.5000 [IU]/h | INTRAVENOUS | Status: DC
  Administered 2019-04-24: 2.5 [IU]/h via INTRAVENOUS
  Filled 2019-04-24: qty 1000

## 2019-04-24 MED ORDER — FENTANYL 2.5 MCG/ML W/ROPIVACAINE 0.15% IN NS 100 ML EPIDURAL (ARMC)
12.0000 mL/h | EPIDURAL | Status: DC
  Administered 2019-04-24: 12 mL/h via EPIDURAL

## 2019-04-24 MED ORDER — LIDOCAINE-EPINEPHRINE (PF) 1.5 %-1:200000 IJ SOLN
INTRAMUSCULAR | Status: DC | PRN
  Administered 2019-04-24: 3 mL via PERINEURAL

## 2019-04-24 MED ORDER — WITCH HAZEL-GLYCERIN EX PADS: 1.0000 "application " | MEDICATED_PAD | CUTANEOUS | Status: DC | PRN

## 2019-04-24 MED ORDER — DIPHENHYDRAMINE HCL 50 MG/ML IJ SOLN: 12.5000 mg | INTRAMUSCULAR | Status: DC | PRN

## 2019-04-24 MED ORDER — IBUPROFEN 800 MG PO TABS
800.0000 mg | ORAL_TABLET | Freq: Four times a day (QID) | ORAL | Status: DC
  Administered 2019-04-25 – 2019-04-26 (×5): 800 mg via ORAL
  Filled 2019-04-24 (×5): qty 1

## 2019-04-24 MED ORDER — BENZOCAINE-MENTHOL 20-0.5 % EX AERO
1.0000 "application " | INHALATION_SPRAY | CUTANEOUS | Status: DC | PRN
  Filled 2019-04-24: qty 56

## 2019-04-24 MED ORDER — EPHEDRINE 5 MG/ML INJ
10.0000 mg | INTRAVENOUS | Status: DC | PRN
  Filled 2019-04-24: qty 2

## 2019-04-24 MED ORDER — LIDOCAINE HCL (PF) 1 % IJ SOLN
INTRAMUSCULAR | Status: DC | PRN
  Administered 2019-04-24: 3 mL

## 2019-04-24 MED ORDER — DIPHENHYDRAMINE HCL 25 MG PO CAPS: 25.0000 mg | ORAL_CAPSULE | Freq: Four times a day (QID) | ORAL | Status: DC | PRN

## 2019-04-24 MED ORDER — OXYCODONE-ACETAMINOPHEN 5-325 MG PO TABS: 1.0000 | ORAL_TABLET | ORAL | Status: DC | PRN

## 2019-04-24 MED ORDER — MISOPROSTOL 200 MCG PO TABS
ORAL_TABLET | ORAL | Status: AC
  Filled 2019-04-24: qty 4

## 2019-04-24 MED ORDER — LACTATED RINGERS IV SOLN
500.0000 mL | INTRAVENOUS | Status: DC | PRN
  Administered 2019-04-24: 500 mL via INTRAVENOUS

## 2019-04-24 MED ORDER — LIDOCAINE HCL (PF) 1 % IJ SOLN: 30.0000 mL | INTRAMUSCULAR | Status: DC | PRN

## 2019-04-24 MED ORDER — PHENYLEPHRINE 40 MCG/ML (10ML) SYRINGE FOR IV PUSH (FOR BLOOD PRESSURE SUPPORT)
80.0000 ug | PREFILLED_SYRINGE | INTRAVENOUS | Status: DC | PRN
  Filled 2019-04-24: qty 10

## 2019-04-24 MED ORDER — LACTATED RINGERS IV SOLN
INTRAVENOUS | Status: DC
  Administered 2019-04-24: 13:00:00 via INTRAVENOUS
  Administered 2019-04-24: 900 mL via INTRAVENOUS

## 2019-04-24 MED ORDER — ONDANSETRON HCL 4 MG PO TABS: 4.0000 mg | ORAL_TABLET | ORAL | Status: DC | PRN

## 2019-04-24 MED ORDER — COCONUT OIL OIL: 1.0000 "application " | TOPICAL_OIL | Status: DC | PRN

## 2019-04-24 MED ORDER — OXYTOCIN 40 UNITS IN NORMAL SALINE INFUSION - SIMPLE MED
INTRAVENOUS | Status: AC
  Filled 2019-04-24: qty 1000

## 2019-04-24 MED ORDER — LACTATED RINGERS IV SOLN: 500.0000 mL | Freq: Once | INTRAVENOUS | Status: DC

## 2019-04-24 MED ORDER — OXYTOCIN 40 UNITS IN NORMAL SALINE INFUSION - SIMPLE MED: 2.5000 [IU]/h | INTRAVENOUS | Status: DC

## 2019-04-24 MED ORDER — OXYCODONE-ACETAMINOPHEN 5-325 MG PO TABS: 2.0000 | ORAL_TABLET | ORAL | Status: DC | PRN

## 2019-04-24 MED ORDER — FENTANYL 2.5 MCG/ML W/ROPIVACAINE 0.15% IN NS 100 ML EPIDURAL (ARMC)
EPIDURAL | Status: AC
  Filled 2019-04-24: qty 100

## 2019-04-24 MED ORDER — OXYTOCIN 40 UNITS IN NORMAL SALINE INFUSION - SIMPLE MED
1.0000 m[IU]/min | INTRAVENOUS | Status: DC
  Administered 2019-04-24: 2 m[IU]/min via INTRAVENOUS

## 2019-04-24 MED ORDER — LACTATED RINGERS IV SOLN: INTRAVENOUS | Status: DC

## 2019-04-24 MED ORDER — BUPIVACAINE HCL (PF) 0.25 % IJ SOLN
INTRAMUSCULAR | Status: DC | PRN
  Administered 2019-04-24: 8 mL via EPIDURAL

## 2019-04-24 MED ORDER — SENNOSIDES-DOCUSATE SODIUM 8.6-50 MG PO TABS
2.0000 | ORAL_TABLET | ORAL | Status: DC
  Administered 2019-04-25 – 2019-04-26 (×2): 2 via ORAL
  Filled 2019-04-24 (×2): qty 2

## 2019-04-24 MED ORDER — ONDANSETRON HCL 4 MG/2ML IJ SOLN: 4.0000 mg | Freq: Four times a day (QID) | INTRAMUSCULAR | Status: DC | PRN

## 2019-04-24 MED ORDER — ACETAMINOPHEN 325 MG PO TABS: 650.0000 mg | ORAL_TABLET | ORAL | Status: DC | PRN

## 2019-04-24 MED ORDER — LIDOCAINE HCL (PF) 1 % IJ SOLN
INTRAMUSCULAR | Status: AC
  Filled 2019-04-24: qty 30

## 2019-04-24 MED ORDER — LACTATED RINGERS IV SOLN: 500.0000 mL | INTRAVENOUS | Status: DC | PRN

## 2019-04-24 MED ORDER — OXYTOCIN 10 UNIT/ML IJ SOLN
INTRAMUSCULAR | Status: AC
  Filled 2019-04-24: qty 1

## 2019-04-24 MED ORDER — OXYTOCIN BOLUS FROM INFUSION
500.0000 mL | Freq: Once | INTRAVENOUS | Status: AC
  Administered 2019-04-24: 500 mL via INTRAVENOUS

## 2019-04-24 MED ORDER — TERBUTALINE SULFATE 1 MG/ML IJ SOLN: 0.2500 mg | Freq: Once | INTRAMUSCULAR | Status: DC | PRN

## 2019-04-24 MED ORDER — SOD CITRATE-CITRIC ACID 500-334 MG/5ML PO SOLN: 30.0000 mL | ORAL | Status: DC | PRN

## 2019-04-24 MED ORDER — PRENATAL MULTIVITAMIN CH
1.0000 | ORAL_TABLET | Freq: Every day | ORAL | Status: DC
  Administered 2019-04-25 – 2019-04-26 (×2): 1 via ORAL
  Filled 2019-04-24 (×2): qty 1

## 2019-04-24 NOTE — OB Triage Note (Signed)
Pt arrival to triage with c/o contractions starting on Monday.  Pt states they have gotten worse in intensity.  Pt states they are now about 5 min apart.  Pt denies vaginal bleeding and LOF and states positive fetal movement.  EFM and toco applied and assessing.

## 2019-04-24 NOTE — Progress Notes (Signed)
Intrapartum Progress Note  S: Patient complains of pressure in her bottom.   O: Blood pressure 117/62, pulse 95, temperature 98.1 F (36.7 C), temperature source Oral, resp. rate 19, height 5\' 3"  (1.6 m), weight 102.1 kg, last menstrual period 06/21/2018, SpO2 100 %. Gen App: NAD, comfortable Abdomen: soft, gravid FHT: baseline  140 bpm.  Accels present.  Decels absent. moderate in degree variability.   Tocometer: contractions q 2-53minutes Cervix: 10/100/0 to +1 Extremities: Nontender, no edema.  Pitocin: None  Labs:  No new labs  Assessment:  1: SIUP at [redacted]w[redacted]d in active labor with SROM (moderate meconium) 2. Anemia of pregnancy  Plan:  1. Anticipatefor vaginal delivery soon  Hildred Laser, MD 04/24/2019 2:17 PM

## 2019-04-24 NOTE — Progress Notes (Signed)
Intrapartum Progress Note  S: Patient s/p epidural placement.  Complains that she thinks she ruptured membranes.   O: Blood pressure 137/74, pulse 80, temperature 98.1 F (36.7 C), temperature source Oral, resp. rate 19, height 5\' 3"  (1.6 m), weight 102.1 kg, last menstrual period 06/21/2018, SpO2 99 %. Gen App: NAD, comfortable Abdomen: soft, gravid FHT: baseline  145bpm.  Accels present.  Decels absent. moderate in degree variability.   Tocometer: contractions q 6-8 minutes Cervix: 6/90/-2/SROM with moderate meconium Extremities: Nontender, no edema.  Pitocin: None  Labs:  Results for orders placed or performed during the hospital encounter of 04/24/19  CBC  Result Value Ref Range   WBC 13.6 (H) 4.0 - 10.5 K/uL   RBC 4.07 3.87 - 5.11 MIL/uL   Hemoglobin 9.4 (L) 12.0 - 15.0 g/dL   HCT 62.9 (L) 47.6 - 54.6 %   MCV 73.5 (L) 80.0 - 100.0 fL   MCH 23.1 (L) 26.0 - 34.0 pg   MCHC 31.4 30.0 - 36.0 g/dL   RDW 50.3 (H) 54.6 - 56.8 %   Platelets 306 150 - 400 K/uL   nRBC 0.1 0.0 - 0.2 %  OB RESULTS CONSOLE Varicella zoster antibody, IgG  Result Value Ref Range   Varicella Immune   Type and screen [facility]  Result Value Ref Range   ABO/RH(D) PENDING    Antibody Screen PENDING    Sample Expiration      04/27/2019,2359 Performed at [facility], [address] [place], [place] [address]      Assessment:  1: SIUP at [redacted]w[redacted]d in active labor with SROM 2. Anemia of pregnancy  Plan:  1. Pitocin for augmentation as needed.  2. Hoping for vaginal delivery  Hildred Laser, MD 04/24/2019 1:03 PM

## 2019-04-24 NOTE — H&P (Signed)
Obstetric History and Physical  Jamie ConnorsHannah M Vincent is a 18 y.o. G1P0 with IUP at 806w0d presenting for complaints of contractions x 2 days, worsening overnight. Patient states she has been having  irregular, every 5-10 minutes contractions, none vaginal bleeding, intact membranes, with active fetal movement.    Prenatal Course Source of Care: [facility] with onset of care at 9 weeks Pregnancy complications or risks: Patient Active Problem List   Diagnosis Date Noted  . Obesity in pregnancy 04/24/2019  . Indication for care in labor and delivery, antepartum 04/22/2019  . Decreased fetal movement 01/19/2019  . Encounter for supervision of normal first pregnancy in second trimester 01/02/2019   She plans to breastfeed She desires undecided method for postpartum contraception.   Prenatal labs and studies: ABO, Rh: A/Positive/-- (10/07 0935) Antibody: Negative (02/12 0908) Rubella: 1.86 (10/07 0935) RPR: Non Reactive (02/12 0908)  HBsAg: Negative (10/07 0935)  HIV: Non Reactive (10/07 0935)  ZOX:WRUEAVWUGBS:Negative (04/09 1043) 1 hr Glucola normal Genetic screening normal Anatomy US normal   Past Medical History:  Diagnosis Date  . Medical history non-contributory     Past Surgical History:  Procedure Laterality Date  . NO PAST SURGERIES      OB History  Gravida Para Term Preterm AB Living  1            SAB TAB Ectopic Multiple Live Births               # Outcome Date GA Lbr Len/2nd Weight Sex Delivery Anes PTL Lv  1 Current             Social History   Socioeconomic History  . Marital status: Significant Other    Spouse name: Jamie PandaJared  . Number of children: Not on file  . Years of education: Not on file  . Highest education level: Not on file  Occupational History  . Not on file  Social Needs  . Financial resource strain: Not hard at all  . Food insecurity:    Worry: Never true    Inability: Never true  . Transportation needs:    Medical: No    Non-medical:  No  Tobacco Use  . Smoking status: Never Smoker  . Smokeless tobacco: Never Used  Substance and Sexual Activity  . Alcohol use: Never    Frequency: Never  . Drug use: Never  . Sexual activity: Not Currently    Birth control/protection: Pill  Lifestyle  . Physical activity:    Days per week: 7 days    Minutes per session: 30 min  . Stress: Not at all  Relationships  . Social connections:    Talks on phone: More than three times a week    Gets together: Twice a week    Attends religious service: 1 to 4 times per year    Active member of club or organization: Yes    Attends meetings of clubs or organizations: Never    Relationship status: Living with partner  Other Topics Concern  . Not on file  Social History Narrative  . Not on file    Family History  Problem Relation Age of Onset  . Crohn's disease Mother   . Healthy Father     Medications Prior to Admission  Medication Sig Dispense Refill Last Dose  . Prenatal Vit-Fe Fumarate-FA (MULTIVITAMIN-PRENATAL) 27-0.8 MG TABS tablet Take 1 tablet by mouth daily at 12 noon.   04/23/2019 at Unknown time    Allergies  Allergen Reactions  .  Penicillins Rash    Review of Systems: Negative except for what is mentioned in HPI.  Physical Exam: BP 137/75 (BP Location: Right Arm)   Pulse 84   Temp 98.3 F (36.8 C) (Oral)   Resp 18   Ht 5\' 3"  (1.6 m)   Wt 102.1 kg   LMP 06/21/2018   BMI 39.86 kg/m  CONSTITUTIONAL: Well-developed, well-nourished female in no acute distress.  HENT:  Normocephalic, atraumatic, External right and left ear normal. Oropharynx is clear and moist EYES: Conjunctivae and EOM are normal. Pupils are equal, round, and reactive to light. No scleral icterus.  NECK: Normal range of motion, supple, no masses SKIN: Skin is warm and dry. No rash noted. Not diaphoretic. No erythema. No pallor. NEUROLOGIC: Alert and oriented to person, place, and time. Normal reflexes, muscle tone coordination. No cranial nerve  deficit noted. PSYCHIATRIC: Normal mood and affect. Normal behavior. Normal judgment and thought content. CARDIOVASCULAR: Normal heart rate noted, regular rhythm RESPIRATORY: Effort and breath sounds normal, no problems with respiration noted ABDOMEN: Soft, nontender, nondistended, gravid. MUSCULOSKELETAL: Normal range of motion. No edema and no tenderness. 2+ distal pulses.  Cervical Exam: Dilatation 6 cm   Effacement 90%   Station -3   Presentation: cephalic FHT:  Baseline rate 135 bpm   Variability moderate  Accelerations present   Decelerations none Contractions: Irregular 5-8 minutes   Pertinent Labs/Studies:   No results found for this or any previous visit (from the past 24 hour(s)).  Assessment : Jamie Vincent is a 18 y.o. G1P0 at [redacted]w[redacted]d being admitted for labor at term.  Plan: Labor: Expectant management. Augmentation as needed per protocol with Cytotec. Analgesia as needed. FWB: Reassuring fetal heart tracing.  GBS negative Delivery plan: Hopeful for vaginal delivery    Hildred Laser, MD [facility]

## 2019-04-24 NOTE — Anesthesia Preprocedure Evaluation (Signed)
Anesthesia Evaluation  Patient identified by MRN, date of birth, ID band Patient awake    Reviewed: Allergy & Precautions, NPO status , Patient's Chart, lab work & pertinent test results  Airway Mallampati: II  TM Distance: >3 FB     Dental  (+) Teeth Intact   Pulmonary neg pulmonary ROS,    Pulmonary exam normal        Cardiovascular negative cardio ROS Normal cardiovascular exam     Neuro/Psych negative neurological ROS  negative psych ROS   GI/Hepatic negative GI ROS, Neg liver ROS,   Endo/Other  negative endocrine ROS  Renal/GU negative Renal ROS  negative genitourinary   Musculoskeletal negative musculoskeletal ROS (+)   Abdominal Normal abdominal exam  (+)   Peds negative pediatric ROS (+)  Hematology negative hematology ROS (+)   Anesthesia Other Findings   Reproductive/Obstetrics (+) Pregnancy                             Anesthesia Physical Anesthesia Plan  ASA: II  Anesthesia Plan: Epidural   Post-op Pain Management:    Induction:   PONV Risk Score and Plan:   Airway Management Planned: Natural Airway  Additional Equipment:   Intra-op Plan:   Post-operative Plan:   Informed Consent: I have reviewed the patients History and Physical, chart, labs and discussed the procedure including the risks, benefits and alternatives for the proposed anesthesia with the patient or authorized representative who has indicated his/her understanding and acceptance.   Dental advisory given  Plan Discussed with: CRNA and Surgeon  Anesthesia Plan Comments:         Anesthesia Quick Evaluation  

## 2019-04-24 NOTE — Anesthesia Procedure Notes (Signed)
Epidural Patient location during procedure: OB Start time: 04/24/2019 12:23 PM  Staffing Anesthesiologist: Yves Dill, MD Performed: anesthesiologist   Preanesthetic Checklist Completed: patient identified, site marked, surgical consent, pre-op evaluation, timeout performed, IV checked, risks and benefits discussed and monitors and equipment checked  Epidural Patient position: sitting Prep: Betadine Patient monitoring: heart rate, continuous pulse ox and blood pressure Approach: midline Location: L3-L4 Injection technique: LOR air  Needle:  Needle type: Tuohy  Needle gauge: 17 G Needle length: 9 cm and 9 Catheter type: closed end flexible Catheter size: 19 Gauge Test dose: negative and 1.5% lidocaine with Epi 1:200 K  Assessment Sensory level: T8 Events: blood not aspirated, injection not painful, no injection resistance, negative IV test and no paresthesia  Additional Notes Time out called.  Patient placed in sitting position.  The back was prepped and draped in sterile fashion.  A skin wheal was made in the L3-L4 interspace with 1% Lidocaine plain. A 17G Tuohy needle was advanced to the epidural space by a loss of resistance technique.  No blood or paresthesias. The epidural catheter was advanced 3cm into the epidural space and the TD was negative.  The patient tolerated the procedure well and the catheter was affixed to the back in sterile fashion.Reason for block:procedure for pain

## 2019-04-25 LAB — CBC
HCT: 26.1 % — ABNORMAL LOW (ref 36.0–46.0)
Hemoglobin: 8.3 g/dL — ABNORMAL LOW (ref 12.0–15.0)
MCH: 23.4 pg — ABNORMAL LOW (ref 26.0–34.0)
MCHC: 31.8 g/dL (ref 30.0–36.0)
MCV: 73.7 fL — ABNORMAL LOW (ref 80.0–100.0)
Platelets: 249 10*3/uL (ref 150–400)
RBC: 3.54 MIL/uL — ABNORMAL LOW (ref 3.87–5.11)
RDW: 17.4 % — ABNORMAL HIGH (ref 11.5–15.5)
WBC: 15.5 10*3/uL — ABNORMAL HIGH (ref 4.0–10.5)
nRBC: 0 % (ref 0.0–0.2)

## 2019-04-25 LAB — RPR: RPR Ser Ql: NONREACTIVE

## 2019-04-25 MED ORDER — FERROUS SULFATE 325 (65 FE) MG PO TABS
325.0000 mg | ORAL_TABLET | Freq: Two times a day (BID) | ORAL | Status: DC
  Administered 2019-04-25 – 2019-04-26 (×2): 325 mg via ORAL
  Filled 2019-04-25 (×2): qty 1

## 2019-04-25 NOTE — Progress Notes (Signed)
Patient ID: Jamie Vincent, female   DOB: 2001/02/01, 18 y.o.   MRN: [id number]  Progress Note - Vaginal Delivery   Jamie Vincent is a 18 y.o. G1P1001 now PP day 1 s/p Vaginal, Vacuum (Extractor) .   Subjective:  The patient reports no complaints, up ad lib, voiding, tolerating PO and Not lightheaded despite low hemoglobin.  Objective:  Vital signs in last 24 hours: Temp:  [97.8 F (36.6 C)-98.4 F (36.9 C)] 98 F (36.7 C) (05/07 0720) Pulse Rate:  [79-119] 87 (05/07 0720) Resp:  [18-20] 20 (05/07 0720) BP: (99-152)/(52-85) 135/74 (05/07 0720) SpO2:  [99 %-100 %] 100 % (05/07 0720)  Physical Exam:  General: alert, cooperative and no distress Lochia: appropriate Uterine Fundus: firm    Data Review Recent Labs    04/24/19 1130 04/25/19 0527  HGB 9.4* 8.3*  HCT 29.9* 26.1*    Assessment/Plan: Active Problems:   Indication for care in labor and delivery, antepartum   Obesity in pregnancy   Indication for care in labor or delivery   Labor and delivery indication for care or intervention   Plan for discharge tomorrow  -- Continue routine PP care.     Elonda Husky, M.D. 04/25/2019 10:22 AM

## 2019-04-25 NOTE — Plan of Care (Signed)
Vs stable; up with assistance but pt may now start getting up on her own if she feels she can; has declined all pain meds this shift; breastfeeding and does need assistance

## 2019-04-25 NOTE — Lactation Note (Signed)
This note was copied from a baby's chart. Lactation Consultation Note  Patient Name: Boy Yatziry Vile [id number] Date: 04/25/2019 Reason for consult: Initial assessment   Maternal Data Formula Feeding for Exclusion: No Has patient been taught Hand Expression?: Yes Does the patient have breastfeeding experience prior to this delivery?: No  Feeding Feeding Type: Breast Fed  LATCH Score Latch: Repeated attempts needed to sustain latch, nipple held in mouth throughout feeding, stimulation needed to elicit sucking reflex.  Audible Swallowing: A few with stimulation  Type of Nipple: Flat  Comfort (Breast/Nipple): Soft / non-tender  Hold (Positioning): Assistance needed to correctly position infant at breast and maintain latch.  LATCH Score: 6  Interventions Interventions: Breast feeding basics reviewed  Lactation Tools Discussed/Used     Consult Status Consult Status: Follow-up Follow-up type: In-patient    Trudee Grip 04/25/2019, 1:59 PM

## 2019-04-25 NOTE — Anesthesia Postprocedure Evaluation (Signed)
Anesthesia Post Note  Patient: Jamie Vincent  Procedure(s) Performed: AN AD HOC LABOR EPIDURAL  Patient location during evaluation: Mother Baby Anesthesia Type: Epidural Level of consciousness: awake and alert Pain management: pain level controlled Vital Signs Assessment: post-procedure vital signs reviewed and stable Respiratory status: spontaneous breathing, nonlabored ventilation and respiratory function stable Cardiovascular status: stable Postop Assessment: no headache, no backache, epidural receding and able to ambulate Anesthetic complications: no     Last Vitals:  Vitals:   04/25/19 0334 04/25/19 0720  BP: 136/85 135/74  Pulse: 97 87  Resp: 18 20  Temp: 36.6 C 36.7 C  SpO2: 100% 100%    Last Pain:  Vitals:   04/25/19 0720  TempSrc: Oral  PainSc:                  [name]

## 2019-04-26 MED ORDER — IBUPROFEN 800 MG PO TABS: 800.0000 mg | ORAL_TABLET | Freq: Four times a day (QID) | ORAL | 1 refills | Status: DC

## 2019-04-26 MED ORDER — FERROUS SULFATE 325 (65 FE) MG PO TABS: 325.0000 mg | ORAL_TABLET | Freq: Two times a day (BID) | ORAL | 1 refills | Status: DC

## 2019-04-26 MED ORDER — DOCUSATE SODIUM 100 MG PO CAPS: 100.0000 mg | ORAL_CAPSULE | Freq: Two times a day (BID) | ORAL | 2 refills | Status: DC | PRN

## 2019-04-26 NOTE — Discharge Instructions (Signed)
Vaginal Delivery, Care After °Refer to this sheet in the next few weeks. These instructions provide you with information about caring for yourself after vaginal delivery. Your health care provider may also give you more specific instructions. Your treatment has been planned according to current medical practices, but problems sometimes occur. Call your health care provider if you have any problems or questions. °What can I expect after the procedure? °After vaginal delivery, it is common to have: °· Some bleeding from your vagina. °· Soreness in your abdomen, your vagina, and the area of skin between your vaginal opening and your anus (perineum). °· Pelvic cramps. °· Fatigue. °Follow these instructions at home: °Medicines °· Take over-the-counter and prescription medicines only as told by your health care provider. °· If you were prescribed an antibiotic medicine, take it as told by your health care provider. Do not stop taking the antibiotic until it is finished. °Driving ° °· Do not drive or operate heavy machinery while taking prescription pain medicine. °· Do not drive for 24 hours if you received a sedative. °Lifestyle °· Do not drink alcohol. This is especially important if you are breastfeeding or taking medicine to relieve pain. °· Do not use tobacco products, including cigarettes, chewing tobacco, or e-cigarettes. If you need help quitting, ask your health care provider. °Eating and drinking °· Drink at least 8 eight-ounce glasses of water every day unless you are told not to by your health care provider. If you choose to breastfeed your baby, you may need to drink more water than this. °· Eat high-fiber foods every day. These foods may help prevent or relieve constipation. High-fiber foods include: °? Whole grain cereals and breads. °? Brown rice. °? Beans. °? Fresh fruits and vegetables. °Activity °· Return to your normal activities as told by your health care provider. Ask your health care provider what  activities are safe for you. °· Rest as much as possible. Try to rest or take a nap when your baby is sleeping. °· Do not lift anything that is heavier than your baby or 10 lb (4.5 kg) until your health care provider says that it is safe. °· Talk with your health care provider about when you can engage in sexual activity. This may depend on your: °? Risk of infection. °? Rate of healing. °? Comfort and desire to engage in sexual activity. °Vaginal Care °· If you have an episiotomy or a vaginal tear, check the area every day for signs of infection. Check for: °? More redness, swelling, or pain. °? More fluid or blood. °? Warmth. °? Pus or a bad smell. °· Do not use tampons or douches until your health care provider says this is safe. °· Watch for any blood clots that may pass from your vagina. These may look like clumps of dark red, brown, or black discharge. °General instructions °· Keep your perineum clean and dry as told by your health care provider. °· Wear loose, comfortable clothing. °· Wipe from front to back when you use the toilet. °· Ask your health care provider if you can shower or take a bath. If you had an episiotomy or a perineal tear during labor and delivery, your health care provider may tell you not to take baths for a certain length of time. °· Wear a bra that supports your breasts and fits you well. °· If possible, have someone help you with household activities and help care for your baby for at least a few days after you   not to take baths for a certain length of time.  · Wear a bra that supports your breasts and fits you well.  · If possible, have someone help you with household activities and help care for your baby for at least a few days after you leave the hospital.  · Keep all follow-up visits for you and your baby as told by your health care provider. This is important.  Contact a health care provider if:  · You have:  ? Vaginal discharge that has a bad smell.  ? Difficulty urinating.  ? Pain when urinating.  ? A sudden increase or decrease in the frequency of your bowel movements.  ? More redness, swelling, or pain around your episiotomy or vaginal tear.  ? More fluid or blood coming from your episiotomy or vaginal  tear.  ? Pus or a bad smell coming from your episiotomy or vaginal tear.  ? A fever.  ? A rash.  ? Little or no interest in activities you used to enjoy.  ? Questions about caring for yourself or your baby.  · Your episiotomy or vaginal tear feels warm to the touch.  · Your episiotomy or vaginal tear is separating or does not appear to be healing.  · Your breasts are painful, hard, or turn red.  · You feel unusually sad or worried.  · You feel nauseous or you vomit.  · You pass large blood clots from your vagina. If you pass a blood clot from your vagina, save it to show to your health care provider. Do not flush blood clots down the toilet without having your health care provider look at them.  · You urinate more than usual.  · You are dizzy or light-headed.  · You have not breastfed at all and you have not had a menstrual period for 12 weeks after delivery.  · You have stopped breastfeeding and you have not had a menstrual period for 12 weeks after you stopped breastfeeding.  Get help right away if:  · You have:  ? Pain that does not go away or does not get better with medicine.  ? Chest pain.  ? Difficulty breathing.  ? Blurred vision or spots in your vision.  ? Thoughts about hurting yourself or your baby.  · You develop pain in your abdomen or in one of your legs.  · You develop a severe headache.  · You faint.  · You bleed from your vagina so much that you fill two sanitary pads in one hour.  This information is not intended to replace advice given to you by your health care provider. Make sure you discuss any questions you have with your health care provider.  Document Released: 12/02/2000 Document Revised: 05/18/2016 Document Reviewed: 12/20/2015  Elsevier Interactive Patient Education © 2019 [employer]  Breastfeeding    Choosing to breastfeed is one of the best decisions you can make for yourself and your baby. A change in hormones during pregnancy causes your breasts to make breast milk in your  milk-producing glands. Hormones prevent breast milk from being released before your baby is born. They also prompt milk flow after birth. Once breastfeeding has begun, thoughts of your baby, as well as his or her sucking or crying, can stimulate the release of milk from your milk-producing glands.  Benefits of breastfeeding  Research shows that breastfeeding offers many health benefits for infants and mothers. It also offers a cost-free and convenient way to feed your baby.    For your baby  · Your first milk (colostrum) helps your baby's digestive system to function better.  · Special cells in your milk (antibodies) help your baby to fight off infections.  · Breastfed babies are less likely to develop asthma, allergies, obesity, or type 2 diabetes. They are also at lower risk for sudden infant death syndrome (SIDS).  · Nutrients in breast milk are better able to meet your baby’s needs compared to infant formula.  · Breast milk improves your baby's brain development.  For you  · Breastfeeding helps to create a very special bond between you and your baby.  · Breastfeeding is convenient. Breast milk costs nothing and is always available at the correct temperature.  · Breastfeeding helps to burn calories. It helps you to lose the weight that you gained during pregnancy.  · Breastfeeding makes your uterus return faster to its size before pregnancy. It also slows bleeding (lochia) after you give birth.  · Breastfeeding helps to lower your risk of developing type 2 diabetes, osteoporosis, rheumatoid arthritis, cardiovascular disease, and breast, ovarian, uterine, and endometrial cancer later in life.  Breastfeeding basics  Starting breastfeeding  · Find a comfortable place to sit or lie down, with your neck and back well-supported.  · Place a pillow or a rolled-up blanket under your baby to bring him or her to the level of your breast (if you are seated). Nursing pillows are specially designed to help support your arms and  your baby while you breastfeed.  · Make sure that your baby's tummy (abdomen) is facing your abdomen.  · Gently massage your breast. With your fingertips, massage from the outer edges of your breast inward toward the nipple. This encourages milk flow. If your milk flows slowly, you may need to continue this action during the feeding.  · Support your breast with 4 fingers underneath and your thumb above your nipple (make the letter "C" with your hand). Make sure your fingers are well away from your nipple and your baby’s mouth.  · Stroke your baby's lips gently with your finger or nipple.  · When your baby's mouth is open wide enough, quickly bring your baby to your breast, placing your entire nipple and as much of the areola as possible into your baby's mouth. The areola is the colored area around your nipple.  ? More areola should be visible above your baby's upper lip than below the lower lip.  ? Your baby's lips should be opened and extended outward (flanged) to ensure an adequate, comfortable latch.  ? Your baby's tongue should be between his or her lower gum and your breast.  · Make sure that your baby's mouth is correctly positioned around your nipple (latched). Your baby's lips should create a seal on your breast and be turned out (everted).  · It is common for your baby to suck about 2-3 minutes in order to start the flow of breast milk.  Latching  Teaching your baby how to latch onto your breast properly is very important. An improper latch can cause nipple pain, decreased milk supply, and poor weight gain in your baby. Also, if your baby is not latched onto your nipple properly, he or she may swallow some air during feeding. This can make your baby fussy. Burping your baby when you switch breasts during the feeding can help to get rid of the air. However, teaching your baby to latch on properly is still the best way to prevent fussiness from swallowing air   while breastfeeding.  Signs that your baby has  successfully latched onto your nipple  · Silent tugging or silent sucking, without causing you pain. Infant's lips should be extended outward (flanged).  · Swallowing heard between every 3-4 sucks once your milk has started to flow (after your let-down milk reflex occurs).  · Muscle movement above and in front of his or her ears while sucking.  Signs that your baby has not successfully latched onto your nipple  · Sucking sounds or smacking sounds from your baby while breastfeeding.  · Nipple pain.  If you think your baby has not latched on correctly, slip your finger into the corner of your baby’s mouth to break the suction and place it between your baby's gums. Attempt to start breastfeeding again.  Signs of successful breastfeeding  Signs from your baby  · Your baby will gradually decrease the number of sucks or will completely stop sucking.  · Your baby will fall asleep.  · Your baby's body will relax.  · Your baby will retain a small amount of milk in his or her mouth.  · Your baby will let go of your breast by himself or herself.  Signs from you  · Breasts that have increased in firmness, weight, and size 1-3 hours after feeding.  · Breasts that are softer immediately after breastfeeding.  · Increased milk volume, as well as a change in milk consistency and color by the fifth day of breastfeeding.  · Nipples that are not sore, cracked, or bleeding.  Signs that your baby is getting enough milk  · Wetting at least 1-2 diapers during the first 24 hours after birth.  · Wetting at least 5-6 diapers every 24 hours for the first week after birth. The urine should be clear or pale yellow by the age of 5 days.  · Wetting 6-8 diapers every 24 hours as your baby continues to grow and develop.  · At least 3 stools in a 24-hour period by the age of 5 days. The stool should be soft and yellow.  · At least 3 stools in a 24-hour period by the age of 7 days. The stool should be seedy and yellow.  · No loss of weight greater  than 10% of birth weight during the first 3 days of life.  · Average weight gain of 4-7 oz (113-198 g) per week after the age of 4 days.  · Consistent daily weight gain by the age of 5 days, without weight loss after the age of 2 weeks.  After a feeding, your baby may spit up a small amount of milk. This is normal.  Breastfeeding frequency and duration  Frequent feeding will help you make more milk and can prevent sore nipples and extremely full breasts (breast engorgement). Breastfeed when you feel the need to reduce the fullness of your breasts or when your baby shows signs of hunger. This is called "breastfeeding on demand." Signs that your baby is hungry include:  · Increased alertness, activity, or restlessness.  · Movement of the head from side to side.  · Opening of the mouth when the corner of the mouth or cheek is stroked (rooting).  · Increased sucking sounds, smacking lips, cooing, sighing, or squeaking.  · Hand-to-mouth movements and sucking on fingers or hands.  · Fussing or crying.  Avoid introducing a pacifier to your baby in the first 4-6 weeks after your baby is born. After this time, you may choose to use a pacifier.   Research has shown that pacifier use during the first year of a baby's life decreases the risk of sudden infant death syndrome (SIDS).  Allow your baby to feed on each breast as long as he or she wants. When your baby unlatches or falls asleep while feeding from the first breast, offer the second breast. Because newborns are often sleepy in the first few weeks of life, you may need to awaken your baby to get him or her to feed.  Breastfeeding times will vary from baby to baby. However, the following rules can serve as a guide to help you make sure that your baby is properly fed:  · Newborns (babies 4 weeks of age or younger) may breastfeed every 1-3 hours.  · Newborns should not go without breastfeeding for longer than 3 hours during the day or 5 hours during the night.  · You should  breastfeed your baby a minimum of 8 times in a 24-hour period.  Breast milk pumping         Pumping and storing breast milk allows you to make sure that your baby is exclusively fed your breast milk, even at times when you are unable to breastfeed. This is especially important if you go back to work while you are still breastfeeding, or if you are not able to be present during feedings. Your lactation consultant can help you find a method of pumping that works best for you and give you guidelines about how long it is safe to store breast milk.  Caring for your breasts while you breastfeed  Nipples can become dry, cracked, and sore while breastfeeding. The following recommendations can help keep your breasts moisturized and healthy:  · Avoid using soap on your nipples.  · Wear a supportive bra designed especially for nursing. Avoid wearing underwire-style bras or extremely tight bras (sports bras).  · Air-dry your nipples for 3-4 minutes after each feeding.  · Use only cotton bra pads to absorb leaked breast milk. Leaking of breast milk between feedings is normal.  · Use lanolin on your nipples after breastfeeding. Lanolin helps to maintain your skin's normal moisture barrier. Pure lanolin is not harmful (not toxic) to your baby. You may also hand express a few drops of breast milk and gently massage that milk into your nipples and allow the milk to air-dry.  In the first few weeks after giving birth, some women experience breast engorgement. Engorgement can make your breasts feel heavy, warm, and tender to the touch. Engorgement peaks within 3-5 days after you give birth. The following recommendations can help to ease engorgement:  · Completely empty your breasts while breastfeeding or pumping. You may want to start by applying warm, moist heat (in the shower or with warm, water-soaked hand towels) just before feeding or pumping. This increases circulation and helps the milk flow. If your baby does not completely  empty your breasts while breastfeeding, pump any extra milk after he or she is finished.  · Apply ice packs to your breasts immediately after breastfeeding or pumping, unless this is too uncomfortable for you. To do this:  ? Put ice in a plastic bag.  ? Place a towel between your skin and the bag.  ? Leave the ice on for 20 minutes, 2-3 times a day.  · Make sure that your baby is latched on and positioned properly while breastfeeding.  If engorgement persists after 48 hours of following these recommendations, contact your health care provider or a lactation consultant.    become pregnant while breastfeeding. If birth control is desired, ask your health care provider about options that will be safe while breastfeeding your baby. Where to find more information: [employer]: www.llli.org Contact a health care  provider if:  You feel like you want to stop breastfeeding or have become frustrated with breastfeeding.  Your nipples are cracked or bleeding.  Your breasts are red, tender, or warm.  You have: ? Painful breasts or nipples. ? A swollen area on either breast. ? A fever or chills. ? Nausea or vomiting. ? Drainage other than breast milk from your nipples.  Your breasts do not become full before feedings by the fifth day after you give birth.  You feel sad and depressed.  Your baby is: ? Too sleepy to eat well. ? Having trouble sleeping. ? More than 571 week old and wetting fewer than 6 diapers in a 24-hour period. ? Not gaining weight by 735 days of age.  Your baby has fewer than 3 stools in a 24-hour period.  Your baby's skin or the white parts of his or her eyes become yellow. Get help right away if:  Your baby is overly tired (lethargic) and does not want to wake up and feed.  Your baby develops an unexplained fever. Summary  Breastfeeding offers many health benefits for infant and mothers.  Try to breastfeed your infant when he or she shows early signs of hunger.  Gently tickle or stroke your baby's lips with your finger or nipple to allow the baby to open his or her mouth. Bring the baby to your breast. Make sure that much of the areola is in your baby's mouth. Offer one side and burp the baby before you offer the other side.  Talk with your health care provider or lactation consultant if you have questions or you face problems as you breastfeed. This information is not intended to replace advice given to you by your health care provider. Make sure you discuss any questions you have with your health care provider. Document Released: 12/05/2005 Document Revised: 01/06/2017 Document Reviewed: 01/06/2017 Elsevier Interactive Patient Education  2019 [employer]  Breast Pumping Tips Breast pumping is a way to get milk out of your breasts. You will then store the milk for  your baby to use when you are away from home. There are three ways to pump. You can:  Use your hand to massage and squeeze your breast (hand expression).  Use a hand-held machine to manually pump your milk.  Use an electric machine to pump your milk. In the beginning you may not get much milk. After a few days your breasts should make more. Pumping can help you start making milk after your baby is born. Pumping helps you to keep making milk when you are away from your baby. When should I pump? You can start pumping soon after your baby is born. Follow these tips:  When you are with your baby: ? Pump after you breastfeed. ? Pump from the free breast while you breastfeed.  When you are away from your baby: ? Pump every 2-3 hours for 15 minutes. ? Pump both breasts at the same time if you can.  If your baby drinks formula, pump around the time your baby gets the formula.  If you drank alcohol, wait 2 hours before you pump.  If you are going to have surgery, ask your doctor when you should pump again. How do I get ready to pump? Take  steps to relax. Try these things to help your milk come in:  Smell your baby's blanket or clothes.  Look at a picture or video of your baby.  Sit in a quiet, private space.  Massage your breast and nipple.  Place a cloth on your breast. The cloth should be warm and a little wet.  Play relaxing music.  Picture your milk flowing. What are some tips? General tips for pumping breast milk  Always wash your hands before pumping.  If you do not get much milk or if pumping hurts, try different pump settings or a different kind of pump.  Drink enough fluid so your pee (urine) is clear or pale yellow.  Wear clothing that opens in the front or is easy to take off.  Pump milk into a clean bottle or container.  Do not use anything that has nicotine or tobacco. Examples are cigarettes and e-cigarettes. If you need help quitting, ask your doctor. Tips  for storing breast milk  Store breast milk in a clean, BPA-free container. These include: ? A glass or plastic bottle. ? A milk storage bag.  Store only 2-4 ounces of breast milk in each container.  Swirl the breast milk in the container. Do not shake it.  Write down the date you pumped the milk on the container.  This is how long you can store breast milk: ? Room temperature: 6-8 hours. It is best to use the milk within 4 hours. ? Cooler with ice packs: 24 hours. ? Refrigerator: 5-8 days, if the milk is clean. It is best to use the milk within 3 days. ? Freezer: 9-12 months, if the milk is clean and stored away from the freezer door. It is best to use the milk within 6 months.  Put milk in the back of the refrigerator or freezer.  Thaw frozen milk using warm water. Do not use the microwave. Tips for choosing a breast pump When choosing a pump, keep the following things in mind:  Manual breast pumps do not need electricity. They cost less. They can be hard to use.  Electric breast pumps use electricity. They are more expensive. They are easier to use. They collect more milk.  The suction cup (flange) should be the right size.  Before you buy the pump, check if your insurance will pay for it. Tips for caring for a breast pump  Check the manual that came with your pump for cleaning tips.  Clean the pump after you use it. To do this: 1. Wipe down the electrical part. Use a dry cloth or paper towel. Do not put this part in water or in cleaning products. 2. Wash the plastic parts with soap and warm water. Or use the dishwasher if the manual says it is safe. You do not need to clean the tubing unless it touched breast milk. 3. Let all the parts air dry. Avoid drying them with a cloth or towel. 4. When the parts are clean and dry, put the pump back together. Then store the pump.  If there is water in the tubing when you want to pump: 1. Attach the tubing to the pump. 2. Turn on  the pump. 3. Turn off the pump when the tube is dry.  Try not to touch the inside of pump parts. Summary  Pumping can help you start making milk after your baby is born. It lets you keep making milk when you are away from your baby.  When you  are away from your baby, pump for about 15 minutes every 2-3 hours. Pump both breasts at the same time, if you can. This information is not intended to replace advice given to you by your health care provider. Make sure you discuss any questions you have with your health care provider. Document Released: 05/23/2008 Document Revised: 01/09/2017 Document Reviewed: 01/09/2017 Elsevier Interactive Patient Education  2019 [employer].

## 2019-04-26 NOTE — Lactation Note (Signed)
This note was copied from a baby's chart. Lactation Consultation Note  Patient Name: Jamie Vincent [id number] Date: 04/26/2019 Reason for consult: Follow-up assessment;Mother's request;Difficult latch   Maternal Data Has patient been taught Hand Expression?: Yes Does the patient have breastfeeding experience prior to this delivery?: No  Feeding Feeding Type: Breast Fed  LATCH Score Latch: Grasps breast easily, tongue down, lips flanged, rhythmical sucking.  Audible Swallowing: A few with stimulation  Type of Nipple: Flat  Comfort (Breast/Nipple): Filling, red/small blisters or bruises, mild/mod discomfort  Hold (Positioning): Assistance needed to correctly position infant at breast and maintain latch.  LATCH Score: 6  Interventions Interventions: Breast feeding basics reviewed;Assisted with latch;Skin to skin;Hand express;Reverse pressure;Breast compression;Support pillows;Position options  Lactation Tools Discussed/Used     Consult Status Consult Status: Complete Date: 04/26/19 Follow-up type: Call as needed  Baby had not been feeding well at the breast since birth. LC did not work with family the prior day RN did.  LC today assisted family with latching baby onto breasts by using a 81mm nipple shield. MOB's nipple on the left side is very swollen from fluids used during labor. MOB's right nipple has become everted and she states both nipples usually are.  LC worked with MOB on hand expression, how to use and care for a nipple shield, how to position baby for a good latch, using blankets to help support her breasts, feeding cues, signs of fullness, I/Os and where and how to receive breastfeeding help and support after d/c.  Parents are very comfortable with breastfeeding baby now.  Burnadette Peter 04/26/2019, 12:15 PM

## 2019-04-26 NOTE — Progress Notes (Signed)
Patient discharged home with infant. Discharge instructions, prescriptions and follow up appointment given to and reviewed with patient. Patient verbalized understanding. Patient wheeled out with infant by auxiliary.  

## 2019-04-26 NOTE — Plan of Care (Signed)
Vs stable; up ad lib; taking motrin for pain control; wants to pump now and give baby pumped colostrum; pt is very happy with this plan

## 2019-04-26 NOTE — Discharge Summary (Addendum)
OB Discharge Summary     Patient Name: Jamie Vincent DOB: 01-30-2001 MRN: [id number]  Date of admission: 04/24/2019 Delivering MD: Hildred LaserHERRY, Bert Givans   Date of discharge: 04/26/2019  Admitting diagnosis: contractions Intrauterine pregnancy: 5750w0d     Secondary diagnosis:  Active Problems:   Indication for care in labor and delivery, antepartum   Obesity in pregnancy   Anemia of pregnancy in third trimester  Additional problems: None     Discharge diagnosis: Term Pregnancy Delivered and Anemia                                                                                                Post partum procedures:None  Augmentation: AROM and Pitocin  Complications: None  Hospital course:  Onset of Labor With Vaginal Delivery     18 y.o. yo G1P1001 at 5650w0d was admitted in Active Labor on 04/24/2019. Patient had an uncomplicated labor course as follows:  Membrane Rupture Time/Date: 12:58 PM ,04/24/2019   Intrapartum Procedures: Episiotomy: None [1]                                         Lacerations:  2nd degree [3]  Patient had a delivery of a Viable infant. 04/24/2019  Information for the patient's newborn:  Lissa MoralesDalrymple, Boy Narely [[id number]][[id number]]  Delivery Method: Vag-Vacuum    Pateint had an uncomplicated postpartum course.  She is ambulating, tolerating a regular diet, passing flatus, and urinating well. Patient is discharged home in stable condition on 04/26/19.   Physical exam  Vitals:   04/25/19 0720 04/25/19 1632 04/25/19 2322 04/26/19 0733  BP: 135/74 132/73 126/82 122/76  Pulse: 87 93 92 74  Resp: 20 18 20 18   Temp: 98 F (36.7 C) 98.6 F (37 C) 98.1 F (36.7 C) 97.9 F (36.6 C)  TempSrc: Oral Oral Oral Oral  SpO2: 100% 99% 100% 99%  Weight:      Height:       General: alert, cooperative and no distress Lochia: appropriate Uterine Fundus: firm Incision: N/A DVT Evaluation: No evidence of DVT seen on physical exam. Negative Homan's sign. No cords or calf  tenderness. No significant calf/ankle edema. Labs: Lab Results  Component Value Date   WBC 15.5 (H) 04/25/2019   HGB 8.3 (L) 04/25/2019   HCT 26.1 (L) 04/25/2019   MCV 73.7 (L) 04/25/2019   PLT 249 04/25/2019   CMP Latest Ref Rng & Units 05/29/2012  Glucose 65 - 99 mg/dL 85  BUN 8 - 18 mg/dL 16  Creatinine 2.130.50 - 0.861.10 mg/dL 5.78(I0.47(L)  Sodium 696132 - 295141 mmol/L 139  Potassium 3.3 - 4.7 mmol/L 4.2  Chloride 97 - 107 mmol/L 103  CO2 16 - 25 mmol/L 28(H)  Calcium 9.0 - 10.6 mg/dL 9.6  Total Protein 6.4 - 8.6 g/dL 7.6  Total Bilirubin 0.2 - 1.0 mg/dL 0.3  Alkaline Phos 284141 - 499 Unit/L 203  AST 5 - 26 Unit/L 12  ALT U/L 21    Discharge instruction: per  After Visit Summary and "Baby and Me Booklet".  After visit meds:  Allergies as of 04/26/2019      Reactions   Penicillins Rash      Medication List    TAKE these medications   docusate sodium 100 MG capsule Commonly known as:  COLACE Take 1 capsule (100 mg total) by mouth 2 (two) times daily as needed.   ferrous sulfate 325 (65 FE) MG tablet Commonly known as:  FerrouSul Take 1 tablet (325 mg total) by mouth 2 (two) times daily.   ibuprofen 800 MG tablet Commonly known as:  ADVIL Take 1 tablet (800 mg total) by mouth every 6 (six) hours.   multivitamin-prenatal 27-0.8 MG Tabs tablet Take 1 tablet by mouth daily at 12 noon.       Diet: routine diet  Activity: Advance as tolerated. Pelvic rest for 6 weeks.   Outpatient follow up:6 weeks Follow up Appt:No future appointments. Follow up Visit:No follow-ups on file.  Postpartum contraception: Combination OCPs  Newborn Data: Live born female  Birth Weight: 9 lb 2.7 oz (4160 g) APGAR: 8, 9  Newborn Delivery   Birth date/time:  04/24/2019 18:30:00 Delivery type:  Vaginal, Vacuum (Extractor)     Baby Feeding: Breast Disposition:home with mother   04/26/2019 Hildred Laser, MD  [facility]

## 2019-06-05 ENCOUNTER — Telehealth: Payer: Self-pay

## 2019-06-05 NOTE — Progress Notes (Signed)
Pt is present today for post partum visit. Pt stated that she is breastfeeding. Pt would like to start taking pills for birth control. UPT-neg.  Pt stated that she was doing well no problems.  EPDS= 1.

## 2019-06-05 NOTE — Telephone Encounter (Signed)
Pt called no answer LM via voicemail to call the office for prescreening. 

## 2019-06-06 ENCOUNTER — Encounter: Payer: Self-pay | Admitting: Obstetrics and Gynecology

## 2019-06-06 ENCOUNTER — Ambulatory Visit (INDEPENDENT_AMBULATORY_CARE_PROVIDER_SITE_OTHER): Payer: Medicaid Other | Admitting: Obstetrics and Gynecology

## 2019-06-06 ENCOUNTER — Other Ambulatory Visit: Payer: Self-pay

## 2019-06-06 DIAGNOSIS — O9081 Anemia of the puerperium: Secondary | ICD-10-CM

## 2019-06-06 DIAGNOSIS — Z30011 Encounter for initial prescription of contraceptive pills: Secondary | ICD-10-CM

## 2019-06-06 LAB — POCT URINE PREGNANCY: Preg Test, Ur: NEGATIVE

## 2019-06-06 MED ORDER — NORETHINDRONE 0.35 MG PO TABS: 1.0000 | ORAL_TABLET | Freq: Every day | ORAL | 3 refills | Status: DC

## 2019-06-06 NOTE — Progress Notes (Signed)
   OBSTETRICS POSTPARTUM CLINIC PROGRESS NOTE  Subjective:     Jamie Vincent is a 18 y.o. G91P1001 female who presents for a postpartum visit. She is 6 weeks postpartum following a spontaneous vaginal delivery - vacuum assisted. I have fully reviewed the prenatal and intrapartum course. The delivery was at 40 gestational weeks. Anesthesia: epidural. Postpartum course has been well. Baby's course has been well. Baby is feeding by breast. Bleeding: patient has resumed menses, with  LMP 05/27/2019. Bowel function is normal. Bladder function is normal. Patient is not sexually active. Contraception method desired is oral progesterone-only contraceptive. Postpartum depression screening: negative (EPDS score = 1).  The following portions of the patient's history were reviewed and updated as appropriate: allergies, current medications, past family history, past medical history, past social history, past surgical history and problem list.  Review of Systems Pertinent items noted in HPI and remainder of comprehensive ROS otherwise negative.   Objective:    BP 124/83   Pulse 77   Ht 5\' 3"  (1.6 m)   Wt 195 lb 1.6 oz (88.5 kg)   Breastfeeding Yes   BMI 34.56 kg/m   General:  alert and no distress   Breasts:  inspection negative, no nipple discharge or bleeding, no masses or nodularity palpable  Lungs: clear to auscultation bilaterally  Heart:  regular rate and rhythm, S1, S2 normal, no murmur, click, rub or gallop  Abdomen: soft, non-tender; bowel sounds normal; no masses,  no organomegaly.     Vulva:  normal  Vagina: normal vagina, no discharge, exudate, lesion, or erythema  Cervix:  no cervical motion tenderness and no lesions  Corpus: normal size, contour, position, consistency, mobility, non-tender  Adnexa:  normal adnexa and no mass, fullness, tenderness  Rectal Exam: Not performed.         Labs:  Lab Results  Component Value Date   HGB 8.3 (L) 04/25/2019     Assessment:   1.  Postpartum care following vaginal delivery 2. Encounter for BCP (birth control pills) initial prescription 3. Anemia postpartum  Plan:   1. Contraception: oral progesterone-only contraceptive 2. Will check Hgb for h/o anemia.  3. Follow up in: 6 months or as needed.    Rubie Maid, MD [facility]

## 2019-06-06 NOTE — Patient Instructions (Signed)
Norethindrone tablets (contraception)  What is this medicine?  NORETHINDRONE (nor eth IN drone) is an oral contraceptive. The product contains a female hormone known as a progestin. It is used to prevent pregnancy.  This medicine may be used for other purposes; ask your health care provider or pharmacist if you have questions.  COMMON BRAND NAME(S): Camila, Deblitane 28-Day, Errin, Heather, Jencycla, Jolivette, Lyza, Nor-QD, Nora-BE, Norlyroc, Ortho Micronor, Sharobel 28-Day  What should I tell my health care provider before I take this medicine?  They need to know if you have any of these conditions:  -blood vessel disease or blood clots  -breast, cervical, or vaginal cancer  -diabetes  -heart disease  -kidney disease  -liver disease  -mental depression  -migraine  -seizures  -stroke  -vaginal bleeding  -an unusual or allergic reaction to norethindrone, other medicines, foods, dyes, or preservatives  -pregnant or trying to get pregnant  -breast-feeding  How should I use this medicine?  Take this medicine by mouth with a glass of water. You may take it with or without food. Follow the directions on the prescription label. Take this medicine at the same time each day and in the order directed on the package. Do not take your medicine more often than directed.  Contact your pediatrician regarding the use of this medicine in children. Special care may be needed. This medicine has been used in female children who have started having menstrual periods.  A patient package insert for the product will be given with each prescription and refill. Read this sheet carefully each time. The sheet may change frequently.  Overdosage: If you think you have taken too much of this medicine contact a poison control center or emergency room at once.  NOTE: This medicine is only for you. Do not share this medicine with others.  What if I miss a dose?  Try not to miss a dose. Every time you miss a dose or take a dose late your chance of  pregnancy increases. When 1 pill is missed (even if only 3 hours late), take the missed pill as soon as possible and continue taking a pill each day at the regular time (use a back up method of birth control for the next 48 hours). If more than 1 dose is missed, use an additional birth control method for the rest of your pill pack until menses occurs. Contact your health care professional if more than 1 dose has been missed.  What may interact with this medicine?  Do not take this medicine with any of the following medications:  -amprenavir or fosamprenavir  -bosentan  This medicine may also interact with the following medications:  -antibiotics or medicines for infections, especially rifampin, rifabutin, rifapentine, and griseofulvin, and possibly penicillins or tetracyclines  -aprepitant  -barbiturate medicines, such as phenobarbital  -carbamazepine  -felbamate  -modafinil  -oxcarbazepine  -phenytoin  -ritonavir or other medicines for HIV infection or AIDS  -St. John's wort  -topiramate  This list may not describe all possible interactions. Give your health care provider a list of all the medicines, herbs, non-prescription drugs, or dietary supplements you use. Also tell them if you smoke, drink alcohol, or use illegal drugs. Some items may interact with your medicine.  What should I watch for while using this medicine?  Visit your doctor or health care professional for regular checks on your progress. You will need a regular breast and pelvic exam and Pap smear while on this medicine.  Use an additional   method of birth control during the first cycle that you take these tablets.  If you have any reason to think you are pregnant, stop taking this medicine right away and contact your doctor or health care professional.  If you are taking this medicine for hormone related problems, it may take several cycles of use to see improvement in your condition.  This medicine does not protect you against HIV infection (AIDS)  or any other sexually transmitted diseases.  What side effects may I notice from receiving this medicine?  Side effects that you should report to your doctor or health care professional as soon as possible:  -breast tenderness or discharge  -pain in the abdomen, chest, groin or leg  -severe headache  -skin rash, itching, or hives  -sudden shortness of breath  -unusually weak or tired  -vision or speech problems  -yellowing of skin or eyes  Side effects that usually do not require medical attention (report to your doctor or health care professional if they continue or are bothersome):  -changes in sexual desire  -change in menstrual flow  -facial hair growth  -fluid retention and swelling  -headache  -irritability  -nausea  -weight gain or loss  This list may not describe all possible side effects. Call your doctor for medical advice about side effects. You may report side effects to FDA at 1-800-FDA-1088.  Where should I keep my medicine?  Keep out of the reach of children.  Store at room temperature between 15 and 30 degrees C (59 and 86 degrees F). Throw away any unused medicine after the expiration date.  NOTE: This sheet is a summary. It may not cover all possible information. If you have questions about this medicine, talk to your doctor, pharmacist, or health care provider.  © 2019 [employer]vier/Gold Standard (2012-08-24 16:41:35)

## 2019-06-07 LAB — HEMOGLOBIN AND HEMATOCRIT, BLOOD
Hematocrit: 36.4 % (ref 34.0–46.6)
Hemoglobin: 11.5 g/dL (ref 11.1–15.9)

## 2019-06-20 ENCOUNTER — Other Ambulatory Visit: Payer: Self-pay | Admitting: Obstetrics and Gynecology

## 2019-09-02 ENCOUNTER — Other Ambulatory Visit: Payer: Self-pay

## 2019-09-02 DIAGNOSIS — Z20822 Contact with and (suspected) exposure to covid-19: Secondary | ICD-10-CM

## 2019-09-03 LAB — NOVEL CORONAVIRUS, NAA: SARS-CoV-2, NAA: NOT DETECTED

## 2019-12-05 ENCOUNTER — Encounter: Payer: Medicaid Other | Admitting: Obstetrics and Gynecology

## 2019-12-11 NOTE — Progress Notes (Signed)
Pt is present for 6 month follow up. Pt stated having vaginal discharge with odor and vaginal itching for a couple of days now. Flu vaccine completed in Oct 2020.

## 2019-12-12 ENCOUNTER — Ambulatory Visit (INDEPENDENT_AMBULATORY_CARE_PROVIDER_SITE_OTHER): Payer: Medicaid Other | Admitting: Obstetrics and Gynecology

## 2019-12-12 ENCOUNTER — Other Ambulatory Visit: Payer: Self-pay

## 2019-12-12 ENCOUNTER — Encounter: Payer: Self-pay | Admitting: Obstetrics and Gynecology

## 2019-12-12 VITALS — BP 110/70 | HR 67 | Ht 63.0 in | Wt 224.1 lb

## 2019-12-12 DIAGNOSIS — N760 Acute vaginitis: Secondary | ICD-10-CM | POA: Diagnosis not present

## 2019-12-12 DIAGNOSIS — B3731 Acute candidiasis of vulva and vagina: Secondary | ICD-10-CM

## 2019-12-12 DIAGNOSIS — Z0000 Encounter for general adult medical examination without abnormal findings: Secondary | ICD-10-CM

## 2019-12-12 DIAGNOSIS — B373 Candidiasis of vulva and vagina: Secondary | ICD-10-CM | POA: Diagnosis not present

## 2019-12-12 DIAGNOSIS — B9689 Other specified bacterial agents as the cause of diseases classified elsewhere: Secondary | ICD-10-CM

## 2019-12-12 DIAGNOSIS — E669 Obesity, unspecified: Secondary | ICD-10-CM

## 2019-12-12 DIAGNOSIS — Z01419 Encounter for gynecological examination (general) (routine) without abnormal findings: Secondary | ICD-10-CM

## 2019-12-12 MED ORDER — METRONIDAZOLE 500 MG PO TABS: 500.0000 mg | ORAL_TABLET | Freq: Two times a day (BID) | ORAL | 0 refills | Status: DC

## 2019-12-12 MED ORDER — FLUCONAZOLE 150 MG PO TABS: 150.0000 mg | ORAL_TABLET | ORAL | 3 refills | Status: DC

## 2019-12-12 NOTE — Progress Notes (Signed)
GYNECOLOGY ANNUAL PHYSICAL EXAM PROGRESS NOTE  Subjective:    Jamie Vincent is a 18 y.o. G36P1001 female who presents for an annual exam.  The patient is sexually active (same partner). The patient wears seatbelts: yes. The patient participates in regular exercise: yes (walking daily, began 2 weeks ago). Has the patient ever been transfused or tattooed?: no. The patient reports that there is not domestic violence in her life.    The patient has the following complaints today:  1. Vaginal discharge for several days, mild odor, itching. Discharge is yellow-white. Has tried Monistat 1 OTC with no relief.    Gynecologic History  Menarche age:89 or 12 Patient's last menstrual period was 11/19/2019. Contraception: oral progesterone-only contraceptive History of STI's: Denies Last Pap: Never had one.     OB History  Gravida Para Term Preterm AB Living  1 1 1  0 0 1  SAB TAB Ectopic Multiple Live Births  0 0 0 0 1    # Outcome Date GA Lbr Len/2nd Weight Sex Delivery Anes PTL Lv  1 Term 04/24/19 [redacted]w[redacted]d / 04:18 9 lb 2.7 oz (4.16 kg) M Vag-Vacuum EPI  LIV     Name: Gatton,BOY Alfie     Apgar1: 8  Apgar5: 9    Past Medical History:  Diagnosis Date  . Medical history non-contributory     Past Surgical History:  Procedure Laterality Date  . NO PAST SURGERIES      Family History  Problem Relation Age of Onset  . Crohn's disease Mother   . Healthy Father     Social History   Socioeconomic History  . Marital status: Significant Other    Spouse name: Kevan Ny  . Number of children: Not on file  . Years of education: Not on file  . Highest education level: Not on file  Occupational History  . Not on file  Tobacco Use  . Smoking status: Never Smoker  . Smokeless tobacco: Never Used  Substance and Sexual Activity  . Alcohol use: Never  . Drug use: Never  . Sexual activity: Yes    Birth control/protection: Pill  Other Topics Concern  . Not on file  Social  History Narrative  . Not on file   Social Determinants of Health   Financial Resource Strain: Low Risk   . Difficulty of Paying Living Expenses: Not hard at all  Food Insecurity: No Food Insecurity  . Worried About Charity fundraiser in the Last Year: Never true  . Ran Out of Food in the Last Year: Never true  Transportation Needs: No Transportation Needs  . Lack of Transportation (Medical): No  . Lack of Transportation (Non-Medical): No  Physical Activity: Unknown  . Days of Exercise per Week: Not on file  . Minutes of Exercise per Session: 30 min  Stress: No Stress Concern Present  . Feeling of Stress : Not at all  Social Connections: Not Isolated  . Frequency of Communication with Friends and Family: More than three times a week  . Frequency of Social Gatherings with Friends and Family: Twice a week  . Attends Religious Services: 1 to 4 times per year  . Active Member of Clubs or Organizations: Yes  . Attends Archivist Meetings: Never  . Marital Status: Living with partner  Intimate Partner Violence:   . Fear of Current or Ex-Partner: Not on file  . Emotionally Abused: Not on file  . Physically Abused: Not on file  . Sexually Abused:  Not on file    Current Outpatient Medications on File Prior to Visit  Medication Sig Dispense Refill  . norethindrone (MICRONOR) 0.35 MG tablet Take 1 tablet (0.35 mg total) by mouth daily. 3 Package 3  . Prenatal Vit-Fe Fumarate-FA (MULTIVITAMIN-PRENATAL) 27-0.8 MG TABS tablet Take 1 tablet by mouth daily at 12 noon.     No current facility-administered medications on file prior to visit.    Allergies  Allergen Reactions  . Penicillins Rash      Review of Systems Constitutional: negative for chills, fatigue, fevers and sweats Eyes: negative for irritation, redness and visual disturbance Ears, nose, mouth, throat, and face: negative for hearing loss, nasal congestion, snoring and tinnitus Respiratory: negative for asthma,  cough, sputum Cardiovascular: negative for chest pain, dyspnea, exertional chest pressure/discomfort, irregular heart beat, palpitations and syncope Gastrointestinal: negative for abdominal pain, change in bowel habits, nausea and vomiting Genitourinary: negative for abnormal menstrual periods, genital lesions, sexual problems and vaginal discharge, dysuria and urinary incontinence Integument/breast: negative for breast lump, breast tenderness and nipple discharge Hematologic/lymphatic: negative for bleeding and easy bruising Musculoskeletal:negative for back pain and muscle weakness Neurological: negative for dizziness, headaches, vertigo and weakness Endocrine: negative for diabetic symptoms including polydipsia, polyuria and skin dryness Allergic/Immunologic: negative for hay fever and urticaria        Objective:  Blood pressure 110/70, pulse 67, height 5\' 3"  (1.6 m), weight 224 lb 1.6 oz (101.7 kg), last menstrual period 11/19/2019, not currently breastfeeding. Body mass index is 39.7 kg/m.     General Appearance:    Alert, cooperative, no distress, appears stated age, moderate obesity  Head:    Normocephalic, without obvious abnormality, atraumatic  Eyes:    PERRL, conjunctiva/corneas clear, EOM's intact, both eyes  Ears:    Normal external ear canals, both ears  Nose:   Nares normal, septum midline, mucosa normal, no drainage or sinus tenderness  Throat:   Lips, mucosa, and tongue normal; teeth and gums normal  Neck:   Supple, symmetrical, trachea midline, no adenopathy; thyroid: no enlargement/tenderness/nodules; no carotid bruit or JVD  Back:     Symmetric, no curvature, ROM normal, no CVA tenderness  Lungs:     Clear to auscultation bilaterally, respirations unlabored  Chest Wall:    No tenderness or deformity   Heart:    Regular rate and rhythm, S1 and S2 normal, no murmur, rub or gallop  Breast Exam:    No tenderness, masses, or nipple abnormality  Abdomen:     Soft,  non-tender, bowel sounds active all four quadrants, no masses, no organomegaly.    Genitalia:    Pelvic: mild vulvar erythema and swelling present, but otherwise normal. lVagina without lesions, or tenderness.  Vaginal discharge present, slightly curd-like, no odor.  rectovaginal septum  normal. Cervix normal in appearance, no cervical motion tenderness, no adnexal masses or tenderness.  Uterus normal size, shape, mobile, regular contours, nontender.  Rectal:    Normal external sphincter.  No hemorrhoids appreciated. Internal exam not done.   Extremities:   Extremities normal, atraumatic, no cyanosis or edema  Pulses:   2+ and symmetric all extremities  Skin:   Skin color, texture, turgor normal, no rashes or lesions  Lymph nodes:   Cervical, supraclavicular, and axillary nodes normal  Neurologic:   CNII-XII intact, normal strength, sensation and reflexes throughout    Labs:  Lab Results  Component Value Date   WBC 15.5 (H) 04/25/2019   HGB 11.5 06/06/2019   HCT 36.4 06/06/2019  MCV 73.7 (L) 04/25/2019   PLT 249 04/25/2019    Microscopic wet-mount exam shows numerous clue cells, hyphae and budding yeast present, no trichomonads, no white blood cells. KOH done.    Assessment:   1. Well woman exam with routine gynecological exam   2. Bacterial vaginosis   3. Vaginal yeast infection   4. Obesity (BMI 35.0-39.9 without comorbidity)      Plan:     Blood tests: CBC with diff, Comprehensive metabolic panel and Lipoproteins.  Will return for lab draw in 2 weeks as no lab technician available in office today.  Breast self exam technique reviewed and patient encouraged to perform self-exam monthly. Contraception: OCP (estrogen/progesterone). Discussed healthy lifestyle modifications. Pap smear: not indicated. To begin cervical cancer screening at age 18.  Wet prep notes bacterial vaginosis and yeast present.  Will treat with course of Flagyl, and Diflucan.  Follow up in 1 year for  annual exam.    Hildred Laserherry, Erina Hamme, MD [facility]

## 2019-12-12 NOTE — Patient Instructions (Addendum)
Health Maintenance, Female Adopting a healthy lifestyle and getting preventive care are important in promoting health and wellness. Ask your health care provider about:  The right schedule for you to have regular tests and exams.  Things you can do on your own to prevent diseases and keep yourself healthy. What should I know about diet, weight, and exercise? Eat a healthy diet   Eat a diet that includes plenty of vegetables, fruits, low-fat dairy products, and lean protein.  Do not eat a lot of foods that are high in solid fats, added sugars, or sodium. Maintain a healthy weight Body mass index (BMI) is used to identify weight problems. It estimates body fat based on height and weight. Your health care provider can help determine your BMI and help you achieve or maintain a healthy weight. Get regular exercise Get regular exercise. This is one of the most important things you can do for your health. Most adults should:  Exercise for at least 150 minutes each week. The exercise should increase your heart rate and make you sweat (moderate-intensity exercise).  Do strengthening exercises at least twice a week. This is in addition to the moderate-intensity exercise.  Spend less time sitting. Even light physical activity can be beneficial. Watch cholesterol and blood lipids Have your blood tested for lipids and cholesterol at 20 years of age, then have this test every 5 years. Have your cholesterol levels checked more often if:  Your lipid or cholesterol levels are high.  You are older than 19 years of age.  You are at high risk for heart disease. What should I know about cancer screening? Depending on your health history and family history, you may need to have cancer screening at various ages. This may include screening for:  Breast cancer.  Cervical cancer.  Colorectal cancer.  Skin cancer.  Lung cancer. What should I know about heart disease, diabetes, and high blood  pressure? Blood pressure and heart disease  High blood pressure causes heart disease and increases the risk of stroke. This is more likely to develop in people who have high blood pressure readings, are of African descent, or are overweight.  Have your blood pressure checked: ? Every 3-5 years if you are 18-39 years of age. ? Every year if you are 40 years old or older. Diabetes Have regular diabetes screenings. This checks your fasting blood sugar level. Have the screening done:  Once every three years after age 40 if you are at a normal weight and have a low risk for diabetes.  More often and at a younger age if you are overweight or have a high risk for diabetes. What should I know about preventing infection? Hepatitis B If you have a higher risk for hepatitis B, you should be screened for this virus. Talk with your health care provider to find out if you are at risk for hepatitis B infection. Hepatitis C Testing is recommended for:  Everyone born from 1945 through 1965.  Anyone with known risk factors for hepatitis C. Sexually transmitted infections (STIs)  Get screened for STIs, including gonorrhea and chlamydia, if: ? You are sexually active and are younger than 19 years of age. ? You are older than 19 years of age and your health care provider tells you that you are at risk for this type of infection. ? Your sexual activity has changed since you were last screened, and you are at increased risk for chlamydia or gonorrhea. Ask your health care provider if   you are at risk.  Ask your health care provider about whether you are at high risk for HIV. Your health care provider may recommend a prescription medicine to help prevent HIV infection. If you choose to take medicine to prevent HIV, you should first get tested for HIV. You should then be tested every 3 months for as long as you are taking the medicine. Pregnancy  If you are about to stop having your period (premenopausal) and  you may become pregnant, seek counseling before you get pregnant.  Take 400 to 800 micrograms (mcg) of folic acid every day if you become pregnant.  Ask for birth control (contraception) if you want to prevent pregnancy. Osteoporosis and menopause Osteoporosis is a disease in which the bones lose minerals and strength with aging. This can result in bone fractures. If you are 51 years old or older, or if you are at risk for osteoporosis and fractures, ask your health care provider if you should:  Be screened for bone loss.  Take a calcium or vitamin D supplement to lower your risk of fractures.  Be given hormone replacement therapy (HRT) to treat symptoms of menopause. Follow these instructions at home: Lifestyle  Do not use any products that contain nicotine or tobacco, such as cigarettes, e-cigarettes, and chewing tobacco. If you need help quitting, ask your health care provider.  Do not use street drugs.  Do not share needles.  Ask your health care provider for help if you need support or information about quitting drugs. Alcohol use  Do not drink alcohol if: ? Your health care provider tells you not to drink. ? You are pregnant, may be pregnant, or are planning to become pregnant.  If you drink alcohol: ? Limit how much you use to 0-1 drink a day. ? Limit intake if you are breastfeeding.  Be aware of how much alcohol is in your drink. In the [place] one drink equals one 12 oz bottle of beer (355 mL), one 5 oz glass of wine (148 mL), or one 1 oz glass of hard liquor (44 mL). General instructions  Schedule regular health, dental, and eye exams.  Stay current with your vaccines.  Tell your health care provider if: ? You often feel depressed. ? You have ever been abused or do not feel safe at home. Summary  Adopting a healthy lifestyle and getting preventive care are important in promoting health and wellness.  Follow your health care provider's instructions about healthy  diet, exercising, and getting tested or screened for diseases.  Follow your health care provider's instructions on monitoring your cholesterol and blood pressure. This information is not intended to replace advice given to you by your health care provider. Make sure you discuss any questions you have with your health care provider. Document Released: 06/20/2011 Document Revised: 11/28/2018 Document Reviewed: 11/28/2018 Elsevier Patient Education  [place].    Vaginitis  Vaginitis is irritation and swelling (inflammation) of the vagina. It happens when normal bacteria and yeast in the vagina grow too much. There are many types of this condition. Treatment will depend on the type you have. Follow these instructions at home: Lifestyle  Keep your vagina area clean and dry. ? Avoid using soap. ? Rinse the area with water.  Do not do the following until your doctor says it is okay: ? Wash and clean out the vagina (douche). ? Use tampons. ? Have sex.  Wipe from front to back after going to the bathroom.  Let  air reach your vagina. ? Wear cotton underwear. ? Do not wear: ? Underwear while you sleep. ? Tight pants. ? Thong underwear. ? Underwear or nylons without a cotton panel. ? Take off any wet clothing, such as bathing suits, as soon as possible.  Use gentle, non-scented products. Do not use things that can irritate the vagina, such as fabric softeners. Avoid the following products if they are scented: ? Feminine sprays. ? Detergents. ? Tampons. ? Feminine hygiene products. ? Soaps or bubble baths.  Practice safe sex and use condoms. General instructions  Take over-the-counter and prescription medicines only as told by your doctor.  If you were prescribed an antibiotic medicine, take or use it as told by your doctor. Do not stop taking or using the antibiotic even if you start to feel better.  Keep all follow-up visits as told by your doctor. This is  important. Contact a doctor if:  You have pain in your belly.  You have a fever.  Your symptoms last for more than 2-3 days. Get help right away if:  You have a fever and your symptoms get worse all of a sudden. Summary  Vaginitis is irritation and swelling of the vagina. It can happen when the normal bacteria and yeast in the vagina grow too much. There are many types.  Treatment will depend on the type you have.  Do not douche, use tampons , or have sex until your health care provider approves. When you can return to sex, practice safe sex and use condoms. This information is not intended to replace advice given to you by your health care provider. Make sure you discuss any questions you have with your health care provider. Document Released: 03/03/2009 Document Revised: 11/17/2017 Document Reviewed: 12/27/2016 Elsevier Patient Education  2020 [employer].

## 2019-12-26 ENCOUNTER — Other Ambulatory Visit: Payer: Medicaid Other

## 2019-12-26 ENCOUNTER — Other Ambulatory Visit: Payer: Self-pay

## 2019-12-27 LAB — LIPID PANEL
Chol/HDL Ratio: 3.9 ratio (ref 0.0–4.4)
Cholesterol, Total: 130 mg/dL (ref 100–169)
HDL: 33 mg/dL — ABNORMAL LOW
LDL Chol Calc (NIH): 72 mg/dL (ref 0–109)
Triglycerides: 144 mg/dL — ABNORMAL HIGH (ref 0–89)
VLDL Cholesterol Cal: 25 mg/dL (ref 5–40)

## 2019-12-27 LAB — CBC
Hematocrit: 40.8 % (ref 34.0–46.6)
Hemoglobin: 12.9 g/dL (ref 11.1–15.9)
MCH: 24 pg — ABNORMAL LOW (ref 26.6–33.0)
MCHC: 31.6 g/dL (ref 31.5–35.7)
MCV: 76 fL — ABNORMAL LOW (ref 79–97)
Platelets: 403 10*3/uL (ref 150–450)
RBC: 5.37 x10E6/uL — ABNORMAL HIGH (ref 3.77–5.28)
RDW: 16.8 % — ABNORMAL HIGH (ref 11.7–15.4)
WBC: 10.5 10*3/uL (ref 3.4–10.8)

## 2019-12-27 LAB — COMPREHENSIVE METABOLIC PANEL
ALT: 30 IU/L (ref 0–32)
AST: 22 IU/L (ref 0–40)
Albumin/Globulin Ratio: 1.5 (ref 1.2–2.2)
Albumin: 4.2 g/dL (ref 3.9–5.0)
Alkaline Phosphatase: 87 IU/L (ref 39–117)
BUN/Creatinine Ratio: 20 (ref 9–23)
BUN: 10 mg/dL (ref 6–20)
Bilirubin Total: 0.2 mg/dL (ref 0.0–1.2)
CO2: 21 mmol/L (ref 20–29)
Calcium: 9 mg/dL (ref 8.7–10.2)
Chloride: 102 mmol/L (ref 96–106)
Creatinine, Ser: 0.51 mg/dL — ABNORMAL LOW (ref 0.57–1.00)
GFR calc Af Amer: 161 mL/min/{1.73_m2} (ref >59)
GFR calc non Af Amer: 140 mL/min/{1.73_m2} (ref >59)
Globulin, Total: 2.8 g/dL (ref 1.5–4.5)
Glucose: 91 mg/dL (ref 65–99)
Potassium: 4.8 mmol/L (ref 3.5–5.2)
Sodium: 138 mmol/L (ref 134–144)
Total Protein: 7 g/dL (ref 6.0–8.5)

## 2020-06-10 ENCOUNTER — Ambulatory Visit
Admission: RE | Admit: 2020-06-10 | Discharge: 2020-06-10 | Disposition: A | Discharge status: Home / Self Care | Payer: Medicaid Other | Source: Ambulatory Visit | Attending: Emergency Medicine | Admitting: Emergency Medicine

## 2020-06-10 ENCOUNTER — Other Ambulatory Visit: Payer: Self-pay

## 2020-06-10 VITALS — BP 121/70 | HR 92 | Temp 98.7°F | Resp 18 | Ht 63.0 in | Wt 230.0 lb

## 2020-06-10 DIAGNOSIS — L02419 Cutaneous abscess of limb, unspecified: Secondary | ICD-10-CM | POA: Diagnosis not present

## 2020-06-10 DIAGNOSIS — L03119 Cellulitis of unspecified part of limb: Secondary | ICD-10-CM | POA: Diagnosis not present

## 2020-06-10 MED ORDER — DOXYCYCLINE HYCLATE 100 MG PO CAPS: 100.0000 mg | ORAL_CAPSULE | Freq: Two times a day (BID) | ORAL | 0 refills | Status: AC

## 2020-06-10 MED ORDER — IBUPROFEN 600 MG PO TABS: 600.0000 mg | ORAL_TABLET | Freq: Four times a day (QID) | ORAL | 0 refills | Status: DC | PRN

## 2020-06-10 MED ORDER — HIBICLENS 4 % EX LIQD: Freq: Every day | CUTANEOUS | 0 refills | Status: DC | PRN

## 2020-06-10 NOTE — ED Triage Notes (Signed)
Patient states she had what she thought was a pimple on her left leg and she popped it. She states Sunday she went to urgent care and they gave her an antibiotic. Since Monday she has had low grade temps. The area has been draining.

## 2020-06-10 NOTE — Discharge Instructions (Addendum)
start Claritin or Zyrtec to see if this helps with the itching.  Continue warm compresses.  May use Hibiclens to keep this clean.  This is a surgical antibacterial soap.  600 mg of ibuprofen combined with 1000 mg of Tylenol 3-4 times a day as needed.  Follow-up here or with your primary care physician if not responding to the doxycycline in 48 hours, to the ER if you get worse.

## 2020-06-10 NOTE — ED Provider Notes (Signed)
HPI  SUBJECTIVE:  Jamie Vincent is a 19 y.o. female who presents with an area of erythema, pruritus, pain starting 6 days ago on her left posterior upper thigh.  States it started off as a pimple after being bitten by a "green headed fly".  She states that it started draining purulent, bloody and clearish material 5 days ago.  She was seen at another urgent care, was started on Bactrim DS 1 tab twice a day.  She states that the pain has resolved, but reports continued itching.  No change in the size of the area of erythema or the amount of drainage.  She reports fevers T-max 100.1 with chills, nausea and vomiting x1 after starting the Bactrim.  No nausea, vomiting since, but reports continued elevated temperatures T-max 99.9. She reports headache starting today.  No body aches.  She has been doing warm compresses, boil ease, 1000 mg of Tylenol once a day, keeping it clean with antibacterial soap.  The Bactrim helped with the pain.  Symptoms got markedly worse after she tried popping the pimple.  She took Tylenol within 6 hours of evaluation.  Past medical history negative for diabetes, HIV, cancer, immunocompromise, MRSA.  LMP: 6/2.  Denies the possibility of being pregnant.  PMD: Dr. Valentino Saxon at [facility]    Past Medical History:  Diagnosis Date  . Medical history non-contributory     Past Surgical History:  Procedure Laterality Date  . NO PAST SURGERIES      Family History  Problem Relation Age of Onset  . Crohn's disease Mother   . Healthy Father     Social History   Tobacco Use  . Smoking status: Never Smoker  . Smokeless tobacco: Never Used  Vaping Use  . Vaping Use: Never used  Substance Use Topics  . Alcohol use: Never  . Drug use: Never    No current facility-administered medications for this encounter.  Current Outpatient Medications:  .  norethindrone (MICRONOR) 0.35 MG tablet, Take 1 tablet (0.35 mg total) by mouth daily., Disp: 3 Package, Rfl: 3 .   chlorhexidine (HIBICLENS) 4 % external liquid, Apply topically daily as needed. Dilute 10-15 mL in water, Use daily when bathing for 1-2 weeks, Disp: 120 mL, Rfl: 0 .  doxycycline (VIBRAMYCIN) 100 MG capsule, Take 1 capsule (100 mg total) by mouth 2 (two) times daily for 5 days., Disp: 10 capsule, Rfl: 0 .  ibuprofen (ADVIL) 600 MG tablet, Take 1 tablet (600 mg total) by mouth every 6 (six) hours as needed., Disp: 30 tablet, Rfl: 0  Allergies  Allergen Reactions  . Penicillins Rash     ROS  As noted in HPI.   Physical Exam  BP 121/70 (BP Location: Right Arm)   Pulse 92   Temp 98.7 F (37.1 C) (Oral)   Resp 18   Ht 5\' 3"  (1.6 m)   Wt 104.3 kg   LMP 05/20/2020   SpO2 100%   BMI 40.74 kg/m   Constitutional: Well developed, well nourished, no acute distress Eyes:  EOMI, conjunctiva normal bilaterally HENT: Normocephalic, atraumatic,mucus membranes moist Respiratory: Normal inspiratory effort Cardiovascular: Normal rate GI: nondistended skin: 9 x 5 nontender blanchable area of erythema on posterior left upper thigh.  Marked this with a solid line.  1.5 x 3 cm nontender area of induration with a central white lesion.  No expressible purulent drainage.  Marked this with a dotted line.      Musculoskeletal: no deformities Neurologic: Alert &  oriented x 3, no focal neuro deficits Psychiatric: Speech and behavior appropriate   ED Course   Medications - No data to display  No orders of the defined types were placed in this encounter.   No results found for this or any previous visit (from the past 24 hour(s)). No results found.  ED Clinical Impression  1. Cellulitis and abscess of leg      ED Assessment/Plan  Patient with a cellulitis/small abscess with spontaneous drainage.  There is nothing to I&D today.  Will have her discontinue the Bactrim, start her on doxycycline for 5 days since she does not seem to be showing any improvement with the Bactrim, including  having a fever although this was noted on the first day of starting the Bactrim.    She  denies pain, rather is complaining of itching which I suspect is part of the healing process.  Will have her start Claritin or Zyrtec.  Continue warm compresses.  Keep clean with Hibiclens.  Ibuprofen/Tylenol 3-4 times a day as needed.  Work note for today.  Follow-up here or with her primary care physician if not responding to the doxycycline in 48 hours, to the ER if she gets worse.   Discussed  MDM, treatment plan, and plan for follow-up with patient. Discussed sn/sx that should prompt return to the ED. patient agrees with plan.   Meds ordered this encounter  Medications  . doxycycline (VIBRAMYCIN) 100 MG capsule    Sig: Take 1 capsule (100 mg total) by mouth 2 (two) times daily for 5 days.    Dispense:  10 capsule    Refill:  0  . ibuprofen (ADVIL) 600 MG tablet    Sig: Take 1 tablet (600 mg total) by mouth every 6 (six) hours as needed.    Dispense:  30 tablet    Refill:  0  . chlorhexidine (HIBICLENS) 4 % external liquid    Sig: Apply topically daily as needed. Dilute 10-15 mL in water, Use daily when bathing for 1-2 weeks    Dispense:  120 mL    Refill:  0    *This clinic note was created using [employer]. Therefore, there may be occasional mistakes despite careful proofreading.   ?    Melynda Ripple, MD 06/10/20 [phone]

## 2020-10-30 ENCOUNTER — Other Ambulatory Visit: Payer: Self-pay

## 2020-10-30 ENCOUNTER — Encounter: Payer: Self-pay | Admitting: Obstetrics and Gynecology

## 2020-10-30 ENCOUNTER — Ambulatory Visit (INDEPENDENT_AMBULATORY_CARE_PROVIDER_SITE_OTHER): Payer: Medicaid Other | Admitting: Obstetrics and Gynecology

## 2020-10-30 VITALS — BP 102/66 | HR 79 | Ht 63.0 in | Wt 222.1 lb

## 2020-10-30 DIAGNOSIS — N644 Mastodynia: Secondary | ICD-10-CM | POA: Diagnosis not present

## 2020-10-30 DIAGNOSIS — F321 Major depressive disorder, single episode, moderate: Secondary | ICD-10-CM

## 2020-10-30 MED ORDER — SERTRALINE HCL 50 MG PO TABS
50.0000 mg | ORAL_TABLET | Freq: Every day | ORAL | 6 refills | Status: DC
Start: 2020-10-30 — End: 2020-11-22

## 2020-10-30 NOTE — Progress Notes (Signed)
Pt present for breast pain. Pt c/o of left breast pain and tender to touch x 1-2 weeks. PHQ-9=21.

## 2020-10-30 NOTE — Progress Notes (Signed)
    GYNECOLOGY PROGRESS NOTE  Subjective:    Patient ID: Jamie Vincent, female    DOB: [birth year], 19 y.o.   MRN: [id number]  HPI  Patient is a 19 y.o. [id number] female who presents for complaints of breast soreness, tender to touch x 2 weeks. Denies fevers, chills, erythema. Denies breast masses. Has taken Tylenol which did not help. Does drink ~ 2 sodas per day. Wears supportive bras, no underwire.  Did recently discontinue birth control last month.    The following portions of the patient's history were reviewed and updated as appropriate: allergies, current medications, past family history, past medical history, past social history, past surgical history and problem list.  Review of Systems A comprehensive review of systems was negative except for: Behavioral/Psych: positive for mood changes, lacks motivation or interest in things she used to enjoy. More fatigued.  Symptoms have been ongoing for at least a month or so.  Objective:   Blood pressure 102/66, pulse 79, height 5\' 3"  (1.6 m), weight 222 lb 1.6 oz (100.7 kg), last menstrual period 10/12/2020, not currently breastfeeding. General appearance: alert and no distress Breasts: normal appearance, no masses or tenderness Skin: Skin color, texture, turgor normal. No rashes or lesions    Depression screen [facility] 2/9 10/30/2020  Decreased Interest 3  Down, Depressed, Hopeless 2  PHQ - 2 Score 5  Altered sleeping 2  Tired, decreased energy 3  Change in appetite 3  Feeling bad or failure about yourself  1  Trouble concentrating 3  Moving slowly or fidgety/restless 3  Suicidal thoughts 1  PHQ-9 Score 21  Difficult doing work/chores Extremely dIfficult    Assessment:   1. Breast tenderness in female   2. Current moderate episode of major depressive disorder without prior episode (HCC)    Plan:   1. Breast tenderness  - Discussed limiting caffeine, use of NSAIDs instead of Tylenol, ice packs to area. No masses palpable  today. Advised that it could also be due to recent hormonal changes after discontinuing her birth control. Continue to monitor, and if no relief in the next 1-2 months, return for further evaluation.  2. Depression  - Newly diagnosed depression on today's visit.  Discussion had with patient today regarding symptoms.  Currently no SI/HI. Discussed options for management, including lifestyle interventions (exercise, natural supplements), counseling, medications, or both.  Patient desires medication for now.  Will prescribe Zoloft 50 mg. To follow up in 3-4 weeks for mood check. Can be televisit if desired.    A total of 15 minutes were spent face-to-face with the patient during this encounter and over half of that time dealt with counseling and coordination of care.  13/11/2020, MD [facility]

## 2020-10-30 NOTE — Patient Instructions (Addendum)
 Major Depressive Disorder, Adult Major depressive disorder (MDD) is a mental health condition. MDD often makes you feel sad, hopeless, or helpless. MDD can also cause symptoms in your body. MDD can affect your:  Work.  School.  Relationships.  Other normal activities. MDD can range from mild to very bad. It may occur once (single episode MDD). It can also occur many times (recurrent MDD). The main symptoms of MDD often include:  Feeling sad, depressed, or irritable most of the time.  Loss of interest. MDD symptoms also include:  Sleeping too much or too little.  Eating too much or too little.  A change in your weight.  Feeling tired (fatigue) or having low energy.  Feeling worthless.  Feeling guilty.  Trouble making decisions.  Trouble thinking clearly.  Thoughts of suicide or harming others.  Feeling weak.  Feeling agitated.  Keeping yourself from being around other people (isolation). Follow these instructions at home: Activity  Do these things as told by your doctor: ? Go back to your normal activities. ? Exercise regularly. ? Spend time outdoors. Alcohol  Talk with your doctor about how alcohol can affect your antidepressant medicines.  Do not drink alcohol. Or, limit how much alcohol you drink. ? This means no more than 1 drink a day for nonpregnant women and 2 drinks a day for men. One drink equals one of these:  12 oz of beer.  5 oz of wine.  1 oz of hard liquor. General instructions  Take over-the-counter and prescription medicines only as told by your doctor.  Eat a healthy diet.  Get plenty of sleep.  Find activities that you enjoy. Make time to do them.  Think about joining a support group. Your doctor may be able to suggest a group for you.  Keep all follow-up visits as told by your doctor. This is important. Where to find more information:  National Alliance on Mental Illness: ? www.nami.org  [place] National Institute of  Mental Health: ? www.nimh.nih.gov  National Suicide Prevention Lifeline: ? 1-800-273-8255. This is free, 24-hour help. Contact a doctor if:  Your symptoms get worse.  You have new symptoms. Get help right away if:  You self-harm.  You see, hear, taste, smell, or feel things that are not present (hallucinate). If you ever feel like you may hurt yourself or others, or have thoughts about taking your own life, get help right away. You can go to your nearest emergency department or call:  Your local emergency services (911 in the [place]  A suicide crisis helpline, such as the National Suicide Prevention Lifeline: ? 1-800-273-8255. This is open 24 hours a day. This information is not intended to replace advice given to you by your health care provider. Make sure you discuss any questions you have with your health care provider. Document Revised: 11/17/2017 Document Reviewed: 08/21/2016 Elsevier Patient Education  2020 Elsevier Inc.  Sertraline tablets What is this medicine? SERTRALINE (SER tra leen) is used to treat depression. It may also be used to treat obsessive compulsive disorder, panic disorder, post-trauma stress, premenstrual dysphoric disorder (PMDD) or social anxiety. This medicine may be used for other purposes; ask your health care provider or pharmacist if you have questions. COMMON BRAND NAME(S): Zoloft What should I tell my health care provider before I take this medicine? They need to know if you have any of these conditions:  bleeding disorders  bipolar disorder or a family history of bipolar disorder  glaucoma  heart disease  high   blood pressure  history of irregular heartbeat  history of low levels of calcium, magnesium, or potassium in the blood  if you often drink alcohol  liver disease  receiving electroconvulsive therapy  seizures  suicidal thoughts, plans, or attempt; a previous suicide attempt by you or a family member  take medicines  that treat or prevent blood clots  thyroid disease  an unusual or allergic reaction to sertraline, other medicines, foods, dyes, or preservatives  pregnant or trying to get pregnant  breast-feeding How should I use this medicine? Take this medicine by mouth with a glass of water. Follow the directions on the prescription label. You can take it with or without food. Take your medicine at regular intervals. Do not take your medicine more often than directed. Do not stop taking this medicine suddenly except upon the advice of your doctor. Stopping this medicine too quickly may cause serious side effects or your condition may worsen. A special MedGuide will be given to you by the pharmacist with each prescription and refill. Be sure to read this information carefully each time. Talk to your pediatrician regarding the use of this medicine in children. While this drug may be prescribed for children as young as 7 years for selected conditions, precautions do apply. Overdosage: If you think you have taken too much of this medicine contact a poison control center or emergency room at once. NOTE: This medicine is only for you. Do not share this medicine with others. What if I miss a dose? If you miss a dose, take it as soon as you can. If it is almost time for your next dose, take only that dose. Do not take double or extra doses. What may interact with this medicine? Do not take this medicine with any of the following medications:  cisapride  dronedarone  linezolid  MAOIs like Carbex, Eldepryl, Marplan, Nardil, and Parnate  methylene blue (injected into a vein)  pimozide  thioridazine This medicine may also interact with the following medications:  alcohol  amphetamines  aspirin and aspirin-like medicines  certain medicines for depression, anxiety, or psychotic disturbances  certain medicines for fungal infections like ketoconazole, fluconazole, posaconazole, and  itraconazole  certain medicines for irregular heart beat like flecainide, quinidine, propafenone  certain medicines for migraine headaches like almotriptan, eletriptan, frovatriptan, naratriptan, rizatriptan, sumatriptan, zolmitriptan  certain medicines for sleep  certain medicines for seizures like carbamazepine, valproic acid, phenytoin  certain medicines that treat or prevent blood clots like warfarin, enoxaparin, dalteparin  cimetidine  digoxin  diuretics  fentanyl  isoniazid  lithium  NSAIDs, medicines for pain and inflammation, like ibuprofen or naproxen  other medicines that prolong the QT interval (cause an abnormal heart rhythm) like dofetilide  rasagiline  safinamide  supplements like St. John's wort, kava kava, valerian  tolbutamide  tramadol  tryptophan This list may not describe all possible interactions. Give your health care provider a list of all the medicines, herbs, non-prescription drugs, or dietary supplements you use. Also tell them if you smoke, drink alcohol, or use illegal drugs. Some items may interact with your medicine. What should I watch for while using this medicine? Tell your doctor if your symptoms do not get better or if they get worse. Visit your doctor or health care professional for regular checks on your progress. Because it may take several weeks to see the full effects of this medicine, it is important to continue your treatment as prescribed by your doctor. Patients and their families should watch   out for new or worsening thoughts of suicide or depression. Also watch out for sudden changes in feelings such as feeling anxious, agitated, panicky, irritable, hostile, aggressive, impulsive, severely restless, overly excited and hyperactive, or not being able to sleep. If this happens, especially at the beginning of treatment or after a change in dose, call your health care professional. You may get drowsy or dizzy. Do not drive, use  machinery, or do anything that needs mental alertness until you know how this medicine affects you. Do not stand or sit up quickly, especially if you are an older patient. This reduces the risk of dizzy or fainting spells. Alcohol may interfere with the effect of this medicine. Avoid alcoholic drinks. Your mouth may get dry. Chewing sugarless gum or sucking hard candy, and drinking plenty of water may help. Contact your doctor if the problem does not go away or is severe. What side effects may I notice from receiving this medicine? Side effects that you should report to your doctor or health care professional as soon as possible:  allergic reactions like skin rash, itching or hives, swelling of the face, lips, or tongue  anxious  black, tarry stools  changes in vision  confusion  elevated mood, decreased need for sleep, racing thoughts, impulsive behavior  eye pain  fast, irregular heartbeat  feeling faint or lightheaded, falls  feeling agitated, angry, or irritable  hallucination, loss of contact with reality  loss of balance or coordination  loss of memory  painful or prolonged erections  restlessness, pacing, inability to keep still  seizures  stiff muscles  suicidal thoughts or other mood changes  trouble sleeping  unusual bleeding or bruising  unusually weak or tired  vomiting Side effects that usually do not require medical attention (report to your doctor or health care professional if they continue or are bothersome):  change in appetite or weight  change in sex drive or performance  diarrhea  increased sweating  indigestion, nausea  tremors This list may not describe all possible side effects. Call your doctor for medical advice about side effects. You may report side effects to FDA at 1-800-FDA-1088. Where should I keep my medicine? Keep out of the reach of children. Store at room temperature between 15 and 30 degrees C (59 and 86 degrees F).  Throw away any unused medicine after the expiration date. NOTE: This sheet is a summary. It may not cover all possible information. If you have questions about this medicine, talk to your doctor, pharmacist, or health care provider.  2020 Elsevier/Gold Standard (2018-11-27 10:09:27)  

## 2020-10-31 ENCOUNTER — Encounter: Payer: Self-pay | Admitting: Obstetrics and Gynecology

## 2020-11-21 ENCOUNTER — Other Ambulatory Visit: Payer: Self-pay | Admitting: Obstetrics and Gynecology

## 2020-11-22 NOTE — Telephone Encounter (Signed)
Patient needs appointment prior to next refill.  

## 2020-11-27 ENCOUNTER — Encounter: Payer: Self-pay | Admitting: Obstetrics and Gynecology

## 2020-11-27 ENCOUNTER — Ambulatory Visit (INDEPENDENT_AMBULATORY_CARE_PROVIDER_SITE_OTHER): Payer: Medicaid Other | Admitting: Obstetrics and Gynecology

## 2020-11-27 ENCOUNTER — Other Ambulatory Visit: Payer: Self-pay

## 2020-11-27 VITALS — Ht 63.0 in | Wt 217.0 lb

## 2020-11-27 DIAGNOSIS — F321 Major depressive disorder, single episode, moderate: Secondary | ICD-10-CM

## 2020-11-27 NOTE — Patient Instructions (Signed)

## 2020-11-27 NOTE — Progress Notes (Signed)
TELEVISIT- Pt having televisit to discuss mood and medication check.  Pt stated that she was taking the Zoloft as prescribed and has noticed a change in her depression since starting the medication.  PHQ-9=4.

## 2020-11-27 NOTE — Progress Notes (Addendum)
Virtual Visit via Telephone Note  I connected with Jamie Vincent on 11/27/20 at  1:00 PM EST by telephone and verified that I am speaking with the correct person using two identifiers.  Location: Patient: Work Provider: Office   I discussed the limitations, risks, security and privacy concerns of performing an evaluation and management service by telephone and the availability of in person appointments. I also discussed with the patient that there may be a patient responsible charge related to this service. The patient expressed understanding and agreed to proceed.   History of Present Illness: Jamie Vincent is a 19 y.o. G86P1001 female who presents for initial mood check after recent diagnosis of new-onset depression. She was initiated on Zoloft 50 mg last month.  Overall feels much better. Has more energy, feeling less fatigued.     Observations/Objective: Height 5\' 3"  (1.6 m), weight 217 lb (98.4 kg), last menstrual period 11/12/2020, not currently breastfeeding. Gen App: patient verbally sounds well, in no acute distress Psych: normal speech, normal thought process. Mood wnl.    Depression screen [facility] 2/9 11/27/2020 10/30/2020  Decreased Interest 0 3  Down, Depressed, Hopeless 0 2  PHQ - 2 Score 0 5  Altered sleeping 0 2  Tired, decreased energy 1 3  Change in appetite 1 3  Feeling bad or failure about yourself  0 1  Trouble concentrating 1 3  Moving slowly or fidgety/restless 1 3  Suicidal thoughts 0 1  PHQ-9 Score 4 21  Difficult doing work/chores Somewhat difficult Extremely dIfficult     Assessment and Plan: Depression - Patient doing well on current dose, but still feels like she has room for even greater improvement in her symptoms. Would like to try a higher dose. Will prescribe 100 mg of Zoloft.  To f/u again in 1 month via televisit. Recommendations are to remain on medication for at least 6 months before attempting to wean off the medication.   Follow Up  Instructions: Follow up in 1 month for televisit.   I discussed the assessment and treatment plan with the patient. The patient was provided an opportunity to ask questions and all were answered. The patient agreed with the plan and demonstrated an understanding of the instructions.   The patient was advised to call back or seek an in-person evaluation if the symptoms worsen or if the condition fails to improve as anticipated.  I provided 8 minutes of non-face-to-face time during this encounter.   13/11/2020, MD

## 2020-12-29 ENCOUNTER — Ambulatory Visit (INDEPENDENT_AMBULATORY_CARE_PROVIDER_SITE_OTHER): Payer: Medicaid Other | Admitting: Obstetrics and Gynecology

## 2020-12-29 ENCOUNTER — Encounter: Payer: Self-pay | Admitting: Obstetrics and Gynecology

## 2020-12-29 ENCOUNTER — Other Ambulatory Visit: Payer: Self-pay

## 2020-12-29 VITALS — Ht 63.0 in

## 2020-12-29 DIAGNOSIS — F321 Major depressive disorder, single episode, moderate: Secondary | ICD-10-CM | POA: Diagnosis not present

## 2020-12-29 MED ORDER — SERTRALINE HCL 100 MG PO TABS
100.0000 mg | ORAL_TABLET | Freq: Every day | ORAL | 3 refills | Status: DC
Start: 2020-12-29 — End: 2021-09-14

## 2020-12-29 MED ORDER — ESCITALOPRAM OXALATE 5 MG PO TABS
5.0000 mg | ORAL_TABLET | Freq: Every day | ORAL | 3 refills | Status: DC
Start: 2020-12-29 — End: 2021-01-27

## 2020-12-29 NOTE — Progress Notes (Signed)
Televisit-Pt having televisit for follow up on depression. Pt's PHQ-9 score 10.

## 2020-12-29 NOTE — Patient Instructions (Signed)
Escitalopram Tablets What is this medicine? ESCITALOPRAM (es sye TAL oh pram) is used to treat depression and certain types of anxiety. This medicine may be used for other purposes; ask your health care provider or pharmacist if you have questions. COMMON BRAND NAME(S): Lexapro What should I tell my health care provider before I take this medicine? They need to know if you have any of these conditions:  bipolar disorder or a family history of bipolar disorder  diabetes  glaucoma  heart disease  kidney or liver disease  receiving electroconvulsive therapy  seizures (convulsions)  suicidal thoughts, plans, or attempt by you or a family member  an unusual or allergic reaction to escitalopram, the related drug citalopram, other medicines, foods, dyes, or preservatives  pregnant or trying to become pregnant  breast-feeding How should I use this medicine? Take this medicine by mouth with a glass of water. Follow the directions on the prescription label. You can take it with or without food. If it upsets your stomach, take it with food. Take your medicine at regular intervals. Do not take it more often than directed. Do not stop taking this medicine suddenly except upon the advice of your doctor. Stopping this medicine too quickly may cause serious side effects or your condition may worsen. A special MedGuide will be given to you by the pharmacist with each prescription and refill. Be sure to read this information carefully each time. Talk to your pediatrician regarding the use of this medicine in children. Special care may be needed. Overdosage: If you think you have taken too much of this medicine contact a poison control center or emergency room at once. NOTE: This medicine is only for you. Do not share this medicine with others. What if I miss a dose? If you miss a dose, take it as soon as you can. If it is almost time for your next dose, take only that dose. Do not take double or  extra doses. What may interact with this medicine? Do not take this medicine with any of the following medications:  certain medicines for fungal infections like fluconazole, itraconazole, ketoconazole, posaconazole, voriconazole  cisapride  citalopram  dronedarone  linezolid  MAOIs like Carbex, Eldepryl, Marplan, Nardil, and Parnate  methylene blue (injected into a vein)  pimozide  thioridazine This medicine may also interact with the following medications:  alcohol  amphetamines  aspirin and aspirin-like medicines  carbamazepine  certain medicines for depression, anxiety, or psychotic disturbances  certain medicines for migraine headache like almotriptan, eletriptan, frovatriptan, naratriptan, rizatriptan, sumatriptan, zolmitriptan  certain medicines for sleep  certain medicines that treat or prevent blood clots like warfarin, enoxaparin, dalteparin  cimetidine  diuretics  dofetilide  fentanyl  furazolidone  isoniazid  lithium  metoprolol  NSAIDs, medicines for pain and inflammation, like ibuprofen or naproxen  other medicines that prolong the QT interval (cause an abnormal heart rhythm)  procarbazine  rasagiline  supplements like St. John's wort, kava kava, valerian  tramadol  tryptophan  ziprasidone This list may not describe all possible interactions. Give your health care provider a list of all the medicines, herbs, non-prescription drugs, or dietary supplements you use. Also tell them if you smoke, drink alcohol, or use illegal drugs. Some items may interact with your medicine. What should I watch for while using this medicine? Tell your doctor if your symptoms do not get better or if they get worse. Visit your doctor or health care professional for regular checks on your progress. Because it may   take several weeks to see the full effects of this medicine, it is important to continue your treatment as prescribed by your doctor. Patients  and their families should watch out for new or worsening thoughts of suicide or depression. Also watch out for sudden changes in feelings such as feeling anxious, agitated, panicky, irritable, hostile, aggressive, impulsive, severely restless, overly excited and hyperactive, or not being able to sleep. If this happens, especially at the beginning of treatment or after a change in dose, call your health care professional. You may get drowsy or dizzy. Do not drive, use machinery, or do anything that needs mental alertness until you know how this medicine affects you. Do not stand or sit up quickly, especially if you are an older patient. This reduces the risk of dizzy or fainting spells. Alcohol may interfere with the effect of this medicine. Avoid alcoholic drinks. Your mouth may get dry. Chewing sugarless gum or sucking hard candy, and drinking plenty of water may help. Contact your doctor if the problem does not go away or is severe. What side effects may I notice from receiving this medicine? Side effects that you should report to your doctor or health care professional as soon as possible:  allergic reactions like skin rash, itching or hives, swelling of the face, lips, or tongue  anxious  black, tarry stools  changes in vision  confusion  elevated mood, decreased need for sleep, racing thoughts, impulsive behavior  eye pain  fast, irregular heartbeat  feeling faint or lightheaded, falls  feeling agitated, angry, or irritable  hallucination, loss of contact with reality  loss of balance or coordination  loss of memory  painful or prolonged erections  restlessness, pacing, inability to keep still  seizures  stiff muscles  suicidal thoughts or other mood changes  trouble sleeping  unusual bleeding or bruising  unusually weak or tired  vomiting Side effects that usually do not require medical attention (report to your doctor or health care professional if they  continue or are bothersome):  changes in appetite  change in sex drive or performance  headache  increased sweating  indigestion, nausea  tremors This list may not describe all possible side effects. Call your doctor for medical advice about side effects. You may report side effects to FDA at 1-800-FDA-1088. Where should I keep my medicine? Keep out of reach of children. Store at room temperature between 15 and 30 degrees C (59 and 86 degrees F). Throw away any unused medicine after the expiration date. NOTE: This sheet is a summary. It may not cover all possible information. If you have questions about this medicine, talk to your doctor, pharmacist, or health care provider.  2021 Elsevier/Gold Standard (2020-10-26 09:53:34)  

## 2020-12-29 NOTE — Progress Notes (Signed)
Virtual Visit via Telephone Note  I connected with Jamie Vincent on 12/29/20 at  4:15 PM EST by telephone and verified that I am speaking with the correct person using two identifiers.  Location: Patient: Home Provider: Office   I discussed the limitations, risks, security and privacy concerns of performing an evaluation and management service by telephone and the availability of in person appointments. I also discussed with the patient that there may be a patient responsible charge related to this service. The patient expressed understanding and agreed to proceed.   History of Present Illness: Jamie Vincent is a 20 y.o. G1P1001 female who presents for 1 month f/u of depression.  Her Zoloft was increased from 50 mg to 100 mg last month to help improve symptoms. Patient notes that she still feels about the same with her symptoms, still noting some intermittent episodes of sadness, and some agitation.     Observations/Objective: Height 5\' 3"  (1.6 m), last menstrual period 12/12/2020, not currently breastfeeding.   Depression screen [facility] 2/9 12/29/2020 11/27/2020 10/30/2020  Decreased Interest 0 0 3  Down, Depressed, Hopeless 0 0 2  PHQ - 2 Score 0 0 5  Altered sleeping 1 0 2  Tired, decreased energy 1 1 3   Change in appetite 3 1 3   Feeling bad or failure about yourself  0 0 1  Trouble concentrating 3 1 3   Moving slowly or fidgety/restless 2 1 3   Suicidal thoughts 0 0 1  PHQ-9 Score 10 4 21   Difficult doing work/chores Somewhat difficult Somewhat difficult Extremely dIfficult     Assessment and Plan: Depression - Patient still symptomatic with increase in medication. Discussed management options of continuing to increase current medication to maximize dosing, adding a second medication to help manage the agitation and sadness, or wean from current medication while bridging to a new medication. Can also consider counseling.  Patient elects to stay on Zoloft and add medication  to help other symptoms. Will prescribe 5 mg of Lexapro.   Follow Up Instructions:  Follow up in 1 month to reassess symptoms.    I discussed the assessment and treatment plan with the patient. The patient was provided an opportunity to ask questions and all were answered. The patient agreed with the plan and demonstrated an understanding of the instructions.   The patient was advised to call back or seek an in-person evaluation if the symptoms worsen or if the condition fails to improve as anticipated.  I provided 8 minutes of non-face-to-face time during this encounter.   13/11/2020, MD  [facility]

## 2021-01-23 ENCOUNTER — Other Ambulatory Visit: Payer: Self-pay | Admitting: Obstetrics and Gynecology

## 2021-01-29 ENCOUNTER — Ambulatory Visit (INDEPENDENT_AMBULATORY_CARE_PROVIDER_SITE_OTHER): Payer: Medicaid Other | Admitting: Obstetrics and Gynecology

## 2021-01-29 ENCOUNTER — Other Ambulatory Visit: Payer: Self-pay

## 2021-01-29 ENCOUNTER — Encounter: Payer: Self-pay | Admitting: Obstetrics and Gynecology

## 2021-01-29 VITALS — Ht 63.0 in | Wt 218.0 lb

## 2021-01-29 DIAGNOSIS — F321 Major depressive disorder, single episode, moderate: Secondary | ICD-10-CM

## 2021-01-29 NOTE — Progress Notes (Signed)
Virtual Visit via Telephone Note  I connected with Jamie Vincent on 01/29/21 at  4:15 PM EST by telephone and verified that I am speaking with the correct person using two identifiers.  Location: Patient: Home Provider: Office   I discussed the limitations, risks, security and privacy concerns of performing an evaluation and management service by telephone and the availability of in person appointments. I also discussed with the patient that there may be a patient responsible charge related to this service. The patient expressed understanding and agreed to proceed.   History of Present Illness:   20 y.o. G60P1001 female who presents for 1 month f/u for mood check. Has symptoms of depression. Was on Zoloft 100 mg and still noting symptoms.  Was also beginning to note some agitation along with her symptoms of sadness.  She was started on Lexapro 5 mg.  Overall today notes her symptoms are much better. Notes only side effect of night sweats.   Observations/Objective: Height 5\' 3"  (1.6 m), weight 218 lb (98.9 kg), last menstrual period 01/12/2021, not currently breastfeeding.  Depression screen [facility] 2/9 01/29/2021 12/29/2020 11/27/2020 10/30/2020  Decreased Interest 0 0 0 3  Down, Depressed, Hopeless 0 0 0 2  PHQ - 2 Score 0 0 0 5  Altered sleeping 0 1 0 2  Tired, decreased energy 1 1 1 3   Change in appetite 1 3 1 3   Feeling bad or failure about yourself  0 0 0 1  Trouble concentrating 1 3 1 3   Moving slowly or fidgety/restless 1 2 1 3   Suicidal thoughts 0 0 0 1  PHQ-9 Score 4 10 4 21   Difficult doing work/chores Somewhat difficult Somewhat difficult Somewhat difficult Extremely dIfficult     Assessment and Plan:  1. Current moderate episode of major depressive disorder without prior episode (HCC) - Feels symptoms have improved with use of Lexapro and Zoloft.  Continue on medications for now.  Will f/u again in 6 months.  At that time, if stable, can consider weaning from Zoloft and  optimizing Lexapro dose to capitalize on monotherapy. Advised that night sweats would likely be temporary.   Follow Up Instructions:    I discussed the assessment and treatment plan with the patient. The patient was provided an opportunity to ask questions and all were answered. The patient agreed with the plan and demonstrated an understanding of the instructions.   The patient was advised to call back or seek an in-person evaluation if the symptoms worsen or if the condition fails to improve as anticipated.  I provided 6 minutes of non-face-to-face time during this encounter.   13/11/2020, MD [facility]

## 2021-01-29 NOTE — Patient Instructions (Signed)

## 2021-01-29 NOTE — Progress Notes (Signed)
Televisit-Pt having televisit for follow up for depression. Pt stated that she was doing better PHQ-9 score 4.

## 2021-02-19 ENCOUNTER — Encounter: Payer: Self-pay | Admitting: Obstetrics and Gynecology

## 2021-04-30 ENCOUNTER — Encounter: Payer: Self-pay | Admitting: Obstetrics and Gynecology

## 2021-05-12 ENCOUNTER — Encounter: Payer: Medicaid Other | Admitting: Obstetrics and Gynecology

## 2021-07-29 ENCOUNTER — Encounter: Payer: Medicaid Other | Admitting: Obstetrics and Gynecology

## 2021-08-06 ENCOUNTER — Encounter: Payer: Medicaid Other | Admitting: Obstetrics and Gynecology

## 2021-08-10 ENCOUNTER — Other Ambulatory Visit: Payer: Self-pay

## 2021-08-10 MED ORDER — FLUCONAZOLE 150 MG PO TABS: 150.0000 mg | ORAL_TABLET | Freq: Once | ORAL | 1 refills | Status: AC

## 2021-08-12 ENCOUNTER — Ambulatory Visit (INDEPENDENT_AMBULATORY_CARE_PROVIDER_SITE_OTHER): Payer: Medicaid Other | Admitting: Obstetrics and Gynecology

## 2021-08-12 ENCOUNTER — Encounter: Payer: Self-pay | Admitting: Obstetrics and Gynecology

## 2021-08-12 ENCOUNTER — Other Ambulatory Visit: Payer: Self-pay | Admitting: Obstetrics and Gynecology

## 2021-08-12 ENCOUNTER — Other Ambulatory Visit (HOSPITAL_COMMUNITY)
Admission: RE | Admit: 2021-08-12 | Discharge: 2021-08-12 | Disposition: A | Discharge status: Home / Self Care | Payer: Medicaid Other | Source: Ambulatory Visit | Attending: Obstetrics and Gynecology | Admitting: Obstetrics and Gynecology

## 2021-08-12 ENCOUNTER — Other Ambulatory Visit: Payer: Self-pay

## 2021-08-12 VITALS — BP 120/81 | HR 62 | Ht 63.0 in | Wt 221.8 lb

## 2021-08-12 DIAGNOSIS — Z124 Encounter for screening for malignant neoplasm of cervix: Secondary | ICD-10-CM | POA: Diagnosis not present

## 2021-08-12 DIAGNOSIS — Z1159 Encounter for screening for other viral diseases: Secondary | ICD-10-CM

## 2021-08-12 DIAGNOSIS — Z13220 Encounter for screening for lipoid disorders: Secondary | ICD-10-CM

## 2021-08-12 DIAGNOSIS — Z131 Encounter for screening for diabetes mellitus: Secondary | ICD-10-CM

## 2021-08-12 DIAGNOSIS — E669 Obesity, unspecified: Secondary | ICD-10-CM | POA: Diagnosis not present

## 2021-08-12 DIAGNOSIS — Z01419 Encounter for gynecological examination (general) (routine) without abnormal findings: Secondary | ICD-10-CM

## 2021-08-12 MED ORDER — NORETHINDRONE 0.35 MG PO TABS: 1.0000 | ORAL_TABLET | Freq: Every day | ORAL | 3 refills | Status: DC

## 2021-08-12 MED ORDER — ORLISTAT 60 MG PO CAPS: 60.0000 mg | ORAL_CAPSULE | Freq: Three times a day (TID) | ORAL | 0 refills | Status: DC

## 2021-08-12 NOTE — Patient Instructions (Signed)
Breast Self-Awareness Breast self-awareness is knowing how your breasts look and feel. Doing breast self-awareness is important. It allows you to catch a breast problem early while it is still small and can be treated. All women should do breast self-awareness, including women who have had breast implants. Tell your doctorif you notice a change in your breasts. What you need: A mirror. A well-lit room. How to do a breast self-exam A breast self-exam is one way to learn what is normal for your breasts and tocheck for changes. To do a breast self-exam: Look for changes  Take off all the clothes above your waist. Stand in front of a mirror in a room with good lighting. Put your hands on your hips. Push your hands down. Look at your breasts and nipples in the mirror to see if one breast or nipple looks different from the other. Check to see if: The shape of one breast is different. The size of one breast is different. There are wrinkles, dips, and bumps in one breast and not the other. Look at each breast for changes in the skin, such as: Redness. Scaly areas. Look for changes in your nipples, such as: Liquid around the nipples. Bleeding. Dimpling. Redness. A change in where the nipples are.  Feel for changes  Lie on your back on the floor. Feel each breast. To do this, follow these steps: Pick a breast to feel. Put the arm closest to that breast above your head. Use your other arm to feel the nipple area of your breast. Feel the area with the pads of your three middle fingers by making small circles with your fingers. For the first circle, press lightly. For the second circle, press harder. For the third circle, press even harder. Keep making circles with your fingers at the different pressures as you move down your breast. Stop when you feel your ribs. Move your fingers a little toward the center of your body. Start making circles with your fingers again, this time going up until  you reach your collarbone. Keep making up-and-down circles until you reach your armpit. Remember to keep using the three pressures. Feel the other breast in the same way. Sit or stand in the tub or shower. With soapy water on your skin, feel each breast the same way you did in step 2 when you were lying on the floor.  Write down what you find Writing down what you find can help you remember what to tell your doctor. Write down: What is normal for each breast. Any changes you find in each breast, including: The kind of changes you find. Whether you have pain. Size and location of any lumps. When you last had your menstrual period. General tips Check your breasts every month. If you are breastfeeding, the best time to check your breasts is after you feed your baby or after you use a breast pump. If you get menstrual periods, the best time to check your breasts is 5-7 days after your menstrual period is over. With time, you will become comfortable with the self-exam, and you will begin to know if there are changes in your breasts. Contact a doctor if you: See a change in the shape or size of your breasts or nipples. See a change in the skin of your breast or nipples, such as red or scaly skin. Have fluid coming from your nipples that is not normal. Find a lump or thick area that was not there before. Have pain in   your breasts. Have any concerns about your breast health. Summary Breast self-awareness includes looking for changes in your breasts, as well as feeling for changes within your breasts. Breast self-awareness should be done in front of a mirror in a well-lit room. You should check your breasts every month. If you get menstrual periods, the best time to check your breasts is 5-7 days after your menstrual period is over. Let your doctor know of any changes you see in your breasts, including changes in size, changes on the skin, pain or tenderness, or fluid from your nipples that is  not normal. This information is not intended to replace advice given to you by your health care provider. Make sure you discuss any questions you have with your healthcare provider. Document Revised: 07/24/2018 Document Reviewed: 07/24/2018 Elsevier Patient Education  2022 Elsevier Inc. Preventive Care 21-39 Years Old, Female Preventive care refers to lifestyle choices and visits with your health care provider that can promote health and wellness. This includes: A yearly physical exam. This is also called an annual wellness visit. Regular dental and eye exams. Immunizations. Screening for certain conditions. Healthy lifestyle choices, such as: Eating a healthy diet. Getting regular exercise. Not using drugs or products that contain nicotine and tobacco. Limiting alcohol use. What can I expect for my preventive care visit? Physical exam Your health care provider may check your: Height and weight. These may be used to calculate your BMI (body mass index). BMI is a measurement that tells if you are at a healthy weight. Heart rate and blood pressure. Body temperature. Skin for abnormal spots. Counseling Your health care provider may ask you questions about your: Past medical problems. Family's medical history. Alcohol, tobacco, and drug use. Emotional well-being. Home life and relationship well-being. Sexual activity. Diet, exercise, and sleep habits. Work and work environment. Access to firearms. Method of birth control. Menstrual cycle. Pregnancy history. What immunizations do I need?  Vaccines are usually given at various ages, according to a schedule. Your health care provider will recommend vaccines for you based on your age, medicalhistory, and lifestyle or other factors, such as travel or where you work. What tests do I need?  Blood tests Lipid and cholesterol levels. These may be checked every 5 years starting at age 20. Hepatitis C test. Hepatitis B  test. Screening Diabetes screening. This is done by checking your blood sugar (glucose) after you have not eaten for a while (fasting). STD (sexually transmitted disease) testing, if you are at risk. BRCA-related cancer screening. This may be done if you have a family history of breast, ovarian, tubal, or peritoneal cancers. Pelvic exam and Pap test. This may be done every 3 years starting at age 21. Starting at age 30, this may be done every 5 years if you have a Pap test in combination with an HPV test. Talk with your health care provider about your test results, treatment options,and if necessary, the need for more tests. Follow these instructions at home: Eating and drinking  Eat a healthy diet that includes fresh fruits and vegetables, whole grains, lean protein, and low-fat dairy products. Take vitamin and mineral supplements as recommended by your health care provider. Do not drink alcohol if: Your health care provider tells you not to drink. You are pregnant, may be pregnant, or are planning to become pregnant. If you drink alcohol: Limit how much you have to 0-1 drink a day. Be aware of how much alcohol is in your drink. In the [place] one   drink equals one 12 oz bottle of beer (355 mL), one 5 oz glass of wine (148 mL), or one 1 oz glass of hard liquor (44 mL).  Lifestyle Take daily care of your teeth and gums. Brush your teeth every morning and night with fluoride toothpaste. Floss one time each day. Stay active. Exercise for at least 30 minutes 5 or more days each week. Do not use any products that contain nicotine or tobacco, such as cigarettes, e-cigarettes, and chewing tobacco. If you need help quitting, ask your health care provider. Do not use drugs. If you are sexually active, practice safe sex. Use a condom or other form of protection to prevent STIs (sexually transmitted infections). If you do not wish to become pregnant, use a form of birth control. If you plan to become  pregnant, see your health care provider for a prepregnancy visit. Find healthy ways to cope with stress, such as: Meditation, yoga, or listening to music. Journaling. Talking to a trusted person. Spending time with friends and family. Safety Always wear your seat belt while driving or riding in a vehicle. Do not drive: If you have been drinking alcohol. Do not ride with someone who has been drinking. When you are tired or distracted. While texting. Wear a helmet and other protective equipment during sports activities. If you have firearms in your house, make sure you follow all gun safety procedures. Seek help if you have been physically or sexually abused. What's next? Go to your health care provider once a year for an annual wellness visit. Ask your health care provider how often you should have your eyes and teeth checked. Stay up to date on all vaccines. This information is not intended to replace advice given to you by your health care provider. Make sure you discuss any questions you have with your healthcare provider. Document Revised: 08/02/2020 Document Reviewed: 08/16/2018 Elsevier Patient Education  2022 Elsevier Inc.  

## 2021-08-12 NOTE — Progress Notes (Signed)
GYNECOLOGY ANNUAL PHYSICAL EXAM PROGRESS NOTE  Subjective:    Jamie Vincent is a 20 y.o. G32P1001 female who presents for an annual exam. The patient has vaginal itching and red spots/dots on legs. The patient is sexually active. The patient participates in regular exercise: yes. Has the patient ever been transfused or tattooed?: no. The patient reports that there is not domestic violence in her life.    Desires to discuss options for weight loss.  Notes that for the past 2 months, she has been changing her diet (cut out sodas/sweet tea, increasing water intake, limiting processed foods), modifying portions, and working out (3-4 days per week, HIT classes, treadmills, or occasional weights), but hardly losing any weight. Thinks that some of her mood issues are associated with her weight. Has also tried intermittent fasting, apple vinegar supplements. Notes that over the past 2 months her weight has fluctuated back and forth 10 lbs.    Menstrual History: Menarche age: 74 Patient's last menstrual period was 07/13/2021 (exact date). Period Cycle (Days): 30 Period Duration (Days): 3-4 Period Pattern: (!) Irregular Menstrual Flow: Moderate Menstrual Control: Maxi pad Menstrual Control Change Freq (Hours): 1 Dysmenorrhea: (!) Moderate Dysmenorrhea Symptoms: Cramping   Gynecologic History:  Contraception: oral progesterone-only contraceptive History of STI's: Denies Last Pap: Patient has never had one.      Upstream - 08/12/21 1512       Pregnancy Intention Screening   Does the patient want to become pregnant in the next year? No    Does the patient's partner want to become pregnant in the next year? No    Would the patient like to discuss contraceptive options today? No      Contraception Wrap Up   Current Method Oral Contraceptive    End Method Oral Contraceptive    Contraception Counseling Provided No            The pregnancy intention screening data noted above  was reviewed. Potential methods of contraception were discussed. The patient elected to proceed with Oral Contraceptive.     OB History  Gravida Para Term Preterm AB Living  1 1 1  0 0 1  SAB IAB Ectopic Multiple Live Births  0 0 0 0 1    # Outcome Date GA Lbr Len/2nd Weight Sex Delivery Anes PTL Lv  1 Term 04/24/19 [redacted]w[redacted]d / 04:18 9 lb 2.7 oz (4.16 kg) M Vag-Vacuum EPI  LIV     Name: Mcquaig,BOY Morgin     Apgar1: 8  Apgar5: 9    Past Medical History:  Diagnosis Date   Medical history non-contributory     Past Surgical History:  Procedure Laterality Date   NO PAST SURGERIES      Family History  Problem Relation Age of Onset   Crohn's disease Mother    Healthy Father     Social History   Socioeconomic History   Marital status: Significant Other    Spouse name: [redacted]w[redacted]d   Number of children: Not on file   Years of education: Not on file   Highest education level: Not on file  Occupational History   Not on file  Tobacco Use   Smoking status: Never   Smokeless tobacco: Never  Vaping Use   Vaping Use: Never used  Substance and Sexual Activity   Alcohol use: Yes    Comment: occass   Drug use: Never   Sexual activity: Yes    Birth control/protection: Pill  Other Topics Concern  Not on file  Social History Narrative   Not on file   Social Determinants of Health   Financial Resource Strain: Not on file  Food Insecurity: Not on file  Transportation Needs: Not on file  Physical Activity: Not on file  Stress: Not on file  Social Connections: Not on file  Intimate Partner Violence: Not on file    Current Outpatient Medications on File Prior to Visit  Medication Sig Dispense Refill   escitalopram (LEXAPRO) 5 MG tablet TAKE 1 TABLET (5 MG TOTAL) BY MOUTH DAILY. 90 tablet 0   norethindrone (MICRONOR) 0.35 MG tablet Take 1 tablet (0.35 mg total) by mouth daily. 3 Package 3   sertraline (ZOLOFT) 100 MG tablet Take 1 tablet (100 mg total) by mouth daily. 90 tablet  3   No current facility-administered medications on file prior to visit.    Allergies  Allergen Reactions   Penicillins Rash     Review of Systems Constitutional: negative for chills, fatigue, fevers and sweats.  Positive for weight gain Eyes: negative for irritation, redness and visual disturbance Ears, nose, mouth, throat, and face: negative for hearing loss, nasal congestion, snoring and tinnitus Respiratory: negative for asthma, cough, sputum Cardiovascular: negative for chest pain, dyspnea, exertional chest pressure/discomfort, irregular heart beat, palpitations and syncope Gastrointestinal: negative for abdominal pain, change in bowel habits, nausea and vomiting Genitourinary: negative for abnormal menstrual periods, genital lesions, sexual problems and vaginal discharge, dysuria and urinary incontinence Integument/breast: negative for breast lump, breast tenderness and nipple discharge Hematologic/lymphatic: negative for bleeding and easy bruising Musculoskeletal:negative for back pain and muscle weakness Neurological: negative for dizziness, headaches, vertigo and weakness Endocrine: negative for diabetic symptoms including polydipsia, polyuria and skin dryness Allergic/Immunologic: negative for hay fever and urticaria      Objective:  Blood pressure 120/81, pulse 62, height 5\' 3"  (1.6 m), weight 221 lb 12.8 oz (100.6 kg), last menstrual period 07/13/2021.  Body mass index is 39.29 kg/m.   General Appearance:    Alert, cooperative, no distress, appears stated age. Moderate obesity  Head:    Normocephalic, without obvious abnormality, atraumatic  Eyes:    PERRL, conjunctiva/corneas clear, EOM's intact, both eyes  Ears:    Normal external ear canals, both ears  Nose:   Nares normal, septum midline, mucosa normal, no drainage or sinus tenderness  Throat:   Lips, mucosa, and tongue normal; teeth and gums normal  Neck:   Supple, symmetrical, trachea midline, no adenopathy;  thyroid: no enlargement/tenderness/nodules; no carotid bruit or JVD  Back:     Symmetric, no curvature, ROM normal, no CVA tenderness  Lungs:     Clear to auscultation bilaterally, respirations unlabored  Chest Wall:    No tenderness or deformity   Heart:    Regular rate and rhythm, S1 and S2 normal, no murmur, rub or gallop  Breast Exam:    No tenderness, masses, or nipple abnormality  Abdomen:     Soft, non-tender, bowel sounds active all four quadrants, no masses, no organomegaly.    Genitalia:    Pelvic:external genitalia normal, vagina without lesions, discharge, or tenderness, rectovaginal septum  normal. Cervix normal in appearance, no cervical motion tenderness, no adnexal masses or tenderness.  Uterus normal size, shape, mobile, regular contours, nontender.  Rectal:    Normal external sphincter.  No hemorrhoids appreciated. Internal exam not done.   Extremities:   Extremities normal, atraumatic, no cyanosis or edema  Pulses:   2+ and symmetric all extremities  Skin:  Skin color, texture, turgor normal, no rashes or lesions  Lymph nodes:   Cervical, supraclavicular, and axillary nodes normal  Neurologic:   CNII-XII intact, normal strength, sensation and reflexes throughout   .  Labs:  Lab Results  Component Value Date   WBC 10.5 12/26/2019   HGB 12.9 12/26/2019   HCT 40.8 12/26/2019   MCV 76 (L) 12/26/2019   PLT 403 12/26/2019    Lab Results  Component Value Date   CREATININE 0.51 (L) 12/26/2019   BUN 10 12/26/2019   NA 138 12/26/2019   K 4.8 12/26/2019   CL 102 12/26/2019   CO2 21 12/26/2019    Lab Results  Component Value Date   ALT 30 12/26/2019   AST 22 12/26/2019   ALKPHOS 87 12/26/2019   BILITOT 0.2 12/26/2019    Lab Results  Component Value Date   TSH 1.470 09/24/2018     Assessment:   1. Encounter for well woman exam with routine gynecological exam   2. Pap smear for cervical cancer screening   3. Need for hepatitis C screening test   4.  Obesity (BMI 35.0-39.9 without comorbidity)   5. Screening for lipid disorders   6. Screening for diabetes mellitus      Plan:  Blood tests: See orders Breast self exam technique reviewed and patient encouraged to perform self-exam monthly. Contraception: oral progesterone-only contraceptive.  Refill given.  Pap smear ordered. Included GC/CT testing based on age-based screening guidelines.  COVID vaccination status:  Declines.  Advised on once in lifetime screening for Hepatitis C. Will order today.  Discussed weight concerns.  Patient is in an active phase when it pertains to weight loss efforts. Desires to try medication for weight loss. The risks and benefits and side effects of medication, such as Alli (Orlistat), Adipex (Phenteramine),  Belviq (lorcarsin), Contrave (buproprion/naltrexone), Qsymia (phentermine/topiramate), and Saxenda (liraglutide) were discussed. The pros and cons of suppressing appetite and boosting metabolism is discussed. Risks of tolerence and addiction is discussed for selected agents discussed. Patient ok to trial Alli.  Pt to call with any negative side effects and agrees to keep follow up appts.  Follow up in 1 year for annual exam, in 1 month for weight loss follow up.    Hildred Laser, MD [facility]

## 2021-08-13 LAB — COMPREHENSIVE METABOLIC PANEL
ALT: 28 IU/L (ref 0–32)
AST: 19 IU/L (ref 0–40)
Albumin/Globulin Ratio: 2.1 (ref 1.2–2.2)
Albumin: 5 g/dL (ref 3.9–5.0)
Alkaline Phosphatase: 81 IU/L (ref 44–121)
BUN/Creatinine Ratio: 24 — ABNORMAL HIGH (ref 9–23)
BUN: 15 mg/dL (ref 6–20)
Bilirubin Total: 0.2 mg/dL (ref 0.0–1.2)
CO2: 25 mmol/L (ref 20–29)
Calcium: 9.8 mg/dL (ref 8.7–10.2)
Chloride: 101 mmol/L (ref 96–106)
Creatinine, Ser: 0.62 mg/dL (ref 0.57–1.00)
Globulin, Total: 2.4 g/dL (ref 1.5–4.5)
Glucose: 88 mg/dL (ref 65–99)
Potassium: 4.5 mmol/L (ref 3.5–5.2)
Sodium: 142 mmol/L (ref 134–144)
Total Protein: 7.4 g/dL (ref 6.0–8.5)
eGFR: 130 mL/min/{1.73_m2} (ref >59)

## 2021-08-13 LAB — LIPID PANEL
Chol/HDL Ratio: 3.8 ratio (ref 0.0–4.4)
Cholesterol, Total: 132 mg/dL (ref 100–199)
HDL: 35 mg/dL — ABNORMAL LOW (ref >39)
LDL Chol Calc (NIH): 76 mg/dL (ref 0–99)
Triglycerides: 113 mg/dL (ref 0–149)
VLDL Cholesterol Cal: 21 mg/dL (ref 5–40)

## 2021-08-13 LAB — CBC
Hematocrit: 40.5 % (ref 34.0–46.6)
Hemoglobin: 13.5 g/dL (ref 11.1–15.9)
MCH: 25.4 pg — ABNORMAL LOW (ref 26.6–33.0)
MCHC: 33.3 g/dL (ref 31.5–35.7)
MCV: 76 fL — ABNORMAL LOW (ref 79–97)
Platelets: 341 10*3/uL (ref 150–450)
RBC: 5.31 x10E6/uL — ABNORMAL HIGH (ref 3.77–5.28)
RDW: 14.9 % (ref 11.7–15.4)
WBC: 9 10*3/uL (ref 3.4–10.8)

## 2021-08-13 LAB — HEMOGLOBIN A1C
Est. average glucose Bld gHb Est-mCnc: 117 mg/dL
Hgb A1c MFr Bld: 5.7 % — ABNORMAL HIGH (ref 4.8–5.6)

## 2021-08-13 LAB — HEPATITIS C ANTIBODY: Hep C Virus Ab: <0.1 s/co ratio (ref 0.0–0.9)

## 2021-08-13 LAB — TSH: TSH: 1.79 u[IU]/mL (ref 0.450–4.500)

## 2021-08-17 MED ORDER — METFORMIN HCL 500 MG PO TABS: ORAL_TABLET | ORAL | 5 refills | Status: DC

## 2021-08-17 MED ORDER — BUPROPION HCL ER (XL) 150 MG PO TB24
150.0000 mg | ORAL_TABLET | Freq: Every day | ORAL | 0 refills | Status: DC
Start: 2021-08-17 — End: 2021-11-09

## 2021-08-18 LAB — CYTOLOGY - PAP
Chlamydia: NEGATIVE
Comment: NEGATIVE
Comment: NEGATIVE
Comment: NORMAL
Diagnosis: UNDETERMINED — AB
High risk HPV: NEGATIVE
Neisseria Gonorrhea: NEGATIVE

## 2021-09-14 ENCOUNTER — Other Ambulatory Visit: Payer: Self-pay

## 2021-09-14 ENCOUNTER — Encounter: Payer: Self-pay | Admitting: Obstetrics and Gynecology

## 2021-09-14 ENCOUNTER — Ambulatory Visit (INDEPENDENT_AMBULATORY_CARE_PROVIDER_SITE_OTHER): Payer: Medicaid Other | Admitting: Obstetrics and Gynecology

## 2021-09-14 VITALS — BP 103/66 | HR 61 | Ht 63.0 in | Wt 216.8 lb

## 2021-09-14 DIAGNOSIS — R7303 Prediabetes: Secondary | ICD-10-CM | POA: Diagnosis not present

## 2021-09-14 DIAGNOSIS — E669 Obesity, unspecified: Secondary | ICD-10-CM | POA: Diagnosis not present

## 2021-09-14 DIAGNOSIS — Z8659 Personal history of other mental and behavioral disorders: Secondary | ICD-10-CM

## 2021-09-14 MED ORDER — TOPIRAMATE 25 MG PO TABS
25.0000 mg | ORAL_TABLET | Freq: Two times a day (BID) | ORAL | 3 refills | Status: DC
Start: 2021-09-14 — End: 2021-11-16

## 2021-09-14 NOTE — Progress Notes (Signed)
    GYNECOLOGY PROGRESS NOTE  Subjective:    Patient ID: Jamie Vincent, female    DOB: 05-Nov-2001, 20 y.o.   MRN: [id number]  HPI  Patient is a 20 y.o. female who presents for 1 month weight management follow up. She has a past history of obesity, Class II and prediabetes. She initiated use of Metformin and Wellbutrin 1 month ago. Is also weaning from Lexapro. Denies any undesirable side effects and reports compliance with medications.    Current interventions:  1. Diet - continuing to work on healthy diet, increasing protein. Now also working to decrease carbs. Drinking adequate water. Is still having some issues with cravings (notes having a sweet tooth).  2. Activity - Is still trying to work out regularly (at least 3-4 days per week).  3. Reports bowel movements are normal.    The following portions of the patient's history were reviewed and updated as appropriate: allergies, current medications, past family history, past medical history, past social history, past surgical history, and problem list.  Review of Systems Pertinent items noted in HPI and remainder of comprehensive ROS otherwise negative.   Objective:    Vitals with BMI 09/14/2021 08/12/2021 01/29/2021  Height 5\' 3"  5\' 3"  5\' 3"   Weight 216 lbs 13 oz 221 lbs 13 oz 218 lbs  BMI 38.41 39.3 38.63  Systolic 103 120 -  Diastolic 66 81 -  Pulse 61 62 -    General appearance: alert and no distress Abdomen: soft, non-tender.  Waist circumference 40 in.    Labs:  Lab Results  Component Value Date   HGBA1C 5.7 (H) 08/12/2021    Assessment:   Weight management Obesity, Body mass index is 38.4 kg/m. Prediabetes H/o depression   Plan:   Weight management  - doing well with weight loss, can continue current management.  Will add Topamax to help better manage cravings.  Will recheck HgbA1c in 2 months after 3 month use of Metformin. Currently on 1 tab BID dosing. H/o depression, doing well transitioning from  Lexapro to Wellbutrin.  Can discontinue Lexapro at this time. Continue Zoloft as well.    A total of 15 minutes were spent face-to-face with the patient during this encounter and over half of that time dealt with counseling and coordination of care.   , MD [facility]

## 2021-09-14 NOTE — Progress Notes (Signed)
Jamie Vincent is a 20 y.o. female here for weight loss management.   Silvano Bilis, LPN [facility]

## 2021-10-06 ENCOUNTER — Other Ambulatory Visit: Payer: Self-pay | Admitting: Obstetrics and Gynecology

## 2021-11-09 ENCOUNTER — Other Ambulatory Visit: Payer: Self-pay | Admitting: Obstetrics and Gynecology

## 2021-11-09 MED ORDER — BUPROPION HCL ER (XL) 150 MG PO TB24: 150.0000 mg | ORAL_TABLET | Freq: Every day | ORAL | 0 refills | Status: DC

## 2021-11-16 ENCOUNTER — Encounter: Payer: Self-pay | Admitting: Obstetrics and Gynecology

## 2021-11-16 ENCOUNTER — Other Ambulatory Visit: Payer: Self-pay

## 2021-11-16 ENCOUNTER — Ambulatory Visit (INDEPENDENT_AMBULATORY_CARE_PROVIDER_SITE_OTHER): Payer: Medicaid Other | Admitting: Obstetrics and Gynecology

## 2021-11-16 VITALS — BP 120/76 | HR 60 | Resp 16 | Ht 63.0 in | Wt 211.7 lb

## 2021-11-16 DIAGNOSIS — R7303 Prediabetes: Secondary | ICD-10-CM

## 2021-11-16 DIAGNOSIS — Z8659 Personal history of other mental and behavioral disorders: Secondary | ICD-10-CM

## 2021-11-16 DIAGNOSIS — E669 Obesity, unspecified: Secondary | ICD-10-CM

## 2021-11-16 MED ORDER — TOPIRAMATE 25 MG PO TABS: 25.0000 mg | ORAL_TABLET | Freq: Two times a day (BID) | ORAL | 3 refills | Status: AC

## 2021-11-16 NOTE — Progress Notes (Signed)
    GYNECOLOGY PROGRESS NOTE  Subjective:    Patient ID: Jamie Vincent, female    DOB: [birth year], 20 y.o.   MRN: [id number]  HPI  Patient is a 20 y.o. female who presents for 3 month weight management follow up. She has a past history of obesity,  Class II and prediabetes. She initiated use of Metformin and Wellbutrin (for treatment of depression, weaned from Lexapro) 3 months ago. Also initiated Topamax for appetite suppression at that time.  Denies any undesirable side effects and reports compliance with medications.  Notes mood symptoms are well controlled.   Current interventions:  1. Diet -  continuing to work on healthy diet, increasing protein. Now also working to decrease carbs. Drinking adequate water. Is still having some issues with cravings (notes having a sweet tooth).  2. Activity - Is still trying to work out regularly (at least 3-4 days per week).  3. Reports bowel movements are normal.    The following portions of the patient's history were reviewed and updated as appropriate: allergies, current medications, past family history, past medical history, past social history, past surgical history, and problem list.  Review of Systems Pertinent items noted in HPI and remainder of comprehensive ROS otherwise negative.   Objective:    Vitals with BMI 11/16/2021 09/14/2021 08/12/2021  Height 5\' 3"  5\' 3"  5\' 3"   Weight 211 lbs 11 oz 216 lbs 13 oz 221 lbs 13 oz  BMI 37.51 38.41 39.3  Systolic 120 103  Diastolic 76 66 81  Pulse 60 61 62    General appearance: alert, cooperative, and no distress Abdomen: soft, non-tender.  Waist circumference 41.5 in.    Labs:   Assessment:   Weight management Obesity, Body mass index is 37.5 kg/m. Prediabetes History of depression  Plan:   Weight management  - doing well with weight loss, can continue current management.  Discussed options for addition of supplement such as phentermine to accelerate weight loss, declines at  this time.  Prediabetes - continue Metformin. Will recheck HgbA1c at next visit.     , MD   [facility]

## 2022-02-16 ENCOUNTER — Other Ambulatory Visit: Payer: Self-pay

## 2022-02-16 ENCOUNTER — Ambulatory Visit (INDEPENDENT_AMBULATORY_CARE_PROVIDER_SITE_OTHER): Payer: Medicaid Other | Admitting: Obstetrics and Gynecology

## 2022-02-16 ENCOUNTER — Encounter: Payer: Self-pay | Admitting: Obstetrics and Gynecology

## 2022-02-16 VITALS — BP 128/78 | HR 74 | Resp 16 | Ht 63.0 in | Wt 198.7 lb

## 2022-02-16 DIAGNOSIS — E669 Obesity, unspecified: Secondary | ICD-10-CM | POA: Diagnosis not present

## 2022-02-16 DIAGNOSIS — R7303 Prediabetes: Secondary | ICD-10-CM | POA: Diagnosis not present

## 2022-02-16 DIAGNOSIS — Z8659 Personal history of other mental and behavioral disorders: Secondary | ICD-10-CM

## 2022-02-16 DIAGNOSIS — Z7689 Persons encountering health services in other specified circumstances: Secondary | ICD-10-CM

## 2022-02-16 NOTE — Progress Notes (Signed)
? ? ?  GYNECOLOGY PROGRESS NOTE ? ?Subjective:  ? ? Patient ID: Jamie Vincent, female    DOB: 05-15-2001, 21 y.o.   MRN: [id number] ? ?HPI ? Patient is a 21 y.o. female who presents for 3 month weight management follow up. She has a past history of obesity, Class II and prediabetes. She initiated use of Wellbutrin (changed from Lexapro) and Metformin 6 months ago.  Was started on Topamax 3 months ago to further assist weight loss efforts and manage cravings. Denies any undesirable side effects and reports compliance with medications. Short term goal weight is 170 lbs.  Long term goal weight is 140-150 lbs.  ?  ?Current interventions:  ?1. Diet - continuing to work on healthy diet, increasing protein. Now also working to decrease carbs. Drinking adequate water. Is still having some issues with cravings (notes having a sweet tooth).  ?2. Activity - Is still trying to work out regularly (at least 3-4 days per week).  ?3. Reports bowel movements are normal.  ? ? ?The following portions of the patient's history were reviewed and updated as appropriate: allergies, current medications, past family history, past medical history, past social history, past surgical history, and problem list. ? ?Review of Systems ?Pertinent items noted in HPI and remainder of comprehensive ROS otherwise negative.  ? ?Objective:  ?  ?Vitals with BMI 02/16/2022 11/16/2021 09/14/2021  ?Height 5\' 3"  5\' 3"  5\' 3"   ?Weight 198 lbs 11 oz 211 lbs 11 oz 216 lbs 13 oz  ?BMI 35.21 37.51 38.41  ?Systolic 128 120  ?Diastolic 78 76 66  ?Pulse 74 60 61  ? ? ?General appearance: alert, cooperative, and no distress ?Abdomen: soft, non-tender.  Waist circumference 39.5 in.  ? ? ?Labs:  ?Lab Results  ?Component Value Date  ? HGBA1C 5.7 (H) 08/12/2021  ? ? ?Lab Results  ?Component Value Date  ? CHOL 132 08/12/2021  ? HDL 35 (L) 08/12/2021  ? LDLCALC 76 08/12/2021  ? TRIG 113 08/12/2021  ? CHOLHDL 3.8 08/12/2021  ? ? ?Assessment:  ? ?Weight management ?Obesity,  Body mass index is 35.2 kg/m?08/14/2021 ?Prediabetes ?H/o depression/anxiety ? ?Plan:  ? ?Weight management  - doing well with weight loss, can continue current management.   ? ?Return to clinic for any scheduled appointments or for any gynecologic concerns as needed. Scheduled for annual exam in August.  ? ? ? ?08/14/2021, MD ?[facility] ? ? ?

## 2022-03-30 ENCOUNTER — Other Ambulatory Visit: Payer: Self-pay | Admitting: Obstetrics and Gynecology

## 2022-03-31 MED ORDER — BUPROPION HCL ER (XL) 150 MG PO TB24: 150.0000 mg | ORAL_TABLET | Freq: Every day | ORAL | 1 refills | Status: AC

## 2022-08-19 NOTE — Patient Instructions (Incomplete)
Preventive Care 50-22 Years Old, Female Preventive care refers to lifestyle choices and visits with your health care provider that can promote health and wellness. At this stage in your life, you may start seeing a primary care physician instead of a pediatrician for your preventive care. Preventive care visits are also called wellness exams. What can I expect for my preventive care visit? Counseling During your preventive care visit, your health care provider may ask about your: Medical history, including: Past medical problems. Family medical history. Pregnancy history. Current health, including: Menstrual cycle. Method of birth control. Emotional well-being. Home life and relationship well-being. Sexual activity and sexual health. Lifestyle, including: Alcohol, nicotine or tobacco, and drug use. Access to firearms. Diet, exercise, and sleep habits. Sunscreen use. Motor vehicle safety. Physical exam Your health care provider may check your: Height and weight. These may be used to calculate your BMI (body mass index). BMI is a measurement that tells if you are at a healthy weight. Waist circumference. This measures the distance around your waistline. This measurement also tells if you are at a healthy weight and may help predict your risk of certain diseases, such as type 2 diabetes and high blood pressure. Heart rate and blood pressure. Body temperature. Skin for abnormal spots. Breasts. What immunizations do I need?  Vaccines are usually given at various ages, according to a schedule. Your health care provider will recommend vaccines for you based on your age, medical history, and lifestyle or other factors, such as travel or where you work. What tests do I need? Screening Your health care provider may recommend screening tests for certain conditions. This may include: Vision and hearing tests. Lipid and cholesterol levels. Pelvic exam and Pap test. Hepatitis B  test. Hepatitis C test. HIV (human immunodeficiency virus) test. STI (sexually transmitted infection) testing, if you are at risk. Tuberculosis skin test if you have symptoms. BRCA-related cancer screening. This may be done if you have a family history of breast, ovarian, tubal, or peritoneal cancers. Talk with your health care provider about your test results, treatment options, and if necessary, the need for more tests. Follow these instructions at home: Eating and drinking  Eat a healthy diet that includes fresh fruits and vegetables, whole grains, lean protein, and low-fat dairy products. Drink enough fluid to keep your urine pale yellow. Do not drink alcohol if: Your health care provider tells you not to drink. You are pregnant, may be pregnant, or are planning to become pregnant. You are under the legal drinking age. In the [place] the legal drinking age is 67. If you drink alcohol: Limit how much you have to 0-1 drink a day. Know how much alcohol is in your drink. In the [place] one drink equals one 12 oz bottle of beer (355 mL), one 5 oz glass of wine (148 mL), or one 1 oz glass of hard liquor (44 mL). Lifestyle Brush your teeth every morning and night with fluoride toothpaste. Floss one time each day. Exercise for at least 30 minutes 5 or more days of the week. Do not use any products that contain nicotine or tobacco. These products include cigarettes, chewing tobacco, and vaping devices, such as e-cigarettes. If you need help quitting, ask your health care provider. Do not use drugs. If you are sexually active, practice safe sex. Use a condom or other form of protection to prevent STIs. If you do not wish to become pregnant, use a form of birth control. If you plan to become pregnant,  see your health care provider for a prepregnancy visit. Find healthy ways to manage stress, such as: Meditation, yoga, or listening to music. Journaling. Talking to a trusted person. Spending time  with friends and family. Safety Always wear your seat belt while driving or riding in a vehicle. Do not drive: If you have been drinking alcohol. Do not ride with someone who has been drinking. When you are tired or distracted. While texting. If you have been using any mind-altering substances or drugs. Wear a helmet and other protective equipment during sports activities. If you have firearms in your house, make sure you follow all gun safety procedures. Seek help if you have been bullied, physically abused, or sexually abused. Use the internet responsibly to avoid dangers, such as online bullying and online sex predators. What's next? Go to your health care provider once a year for an annual wellness visit. Ask your health care provider how often you should have your eyes and teeth checked. Stay up to date on all vaccines. This information is not intended to replace advice given to you by your health care provider. Make sure you discuss any questions you have with your health care provider. Document Revised: 06/02/2021 Document Reviewed: 06/02/2021 Elsevier Patient Education  2023 Elsevier Inc.  

## 2022-08-19 NOTE — Progress Notes (Unsigned)
GYNECOLOGY ANNUAL PHYSICAL EXAM PROGRESS NOTE  Subjective:    Jamie Vincent is a 21 y.o. G65P1001 female who presents for an annual exam. The patient has no complaints today. The patient is sexually active. The patient participates in regular exercise: {yes/no/not asked:9010}. Has the patient ever been transfused or tattooed?: {yes/no/not asked:9010}. The patient reports that there {is/is not:9024} domestic violence in her life.    Menstrual History: Menarche age: 30 No LMP recorded. (Menstrual status: Oral contraceptives).     Gynecologic History:  Contraception: OCP (estrogen/progesterone) History of STI's:  Last Pap: 08/12/2021. Results were: abnormal. Notes h/o abnormal pap smears.   OB History  Gravida Para Term Preterm AB Living  1 1 1  0 0 1  SAB IAB Ectopic Multiple Live Births  0 0 0 0 1    # Outcome Date GA Lbr Len/2nd Weight Sex Delivery Anes PTL Lv  1 Term 04/24/19 [redacted]w[redacted]d / 04:18 9 lb 2.7 oz (4.16 kg) M Vag-Vacuum EPI  LIV     Name: Critz,BOY Chany     Apgar1: 8  Apgar5: 9    Past Medical History:  Diagnosis Date   Medical history non-contributory     Past Surgical History:  Procedure Laterality Date   NO PAST SURGERIES      Family History  Problem Relation Age of Onset   Crohn's disease Mother    Healthy Father     Social History   Socioeconomic History   Marital status: Significant Other    Spouse name: [redacted]w[redacted]d   Number of children: Not on file   Years of education: Not on file   Highest education level: Not on file  Occupational History   Not on file  Tobacco Use   Smoking status: Never   Smokeless tobacco: Never  Vaping Use   Vaping Use: Never used  Substance and Sexual Activity   Alcohol use: Yes    Comment: occass   Drug use: Never   Sexual activity: Yes    Birth control/protection: Pill  Other Topics Concern   Not on file  Social History Narrative   Not on file   Social Determinants of Health   Financial Resource  Strain: Low Risk  (04/22/2019)   Overall Financial Resource Strain (CARDIA)    Difficulty of Paying Living Expenses: Not hard at all  Food Insecurity: No Food Insecurity (04/22/2019)   Hunger Vital Sign    Worried About Running Out of Food in the Last Year: Never true    Ran Out of Food in the Last Year: Never true  Transportation Needs: No Transportation Needs (04/22/2019)   PRAPARE - 06/22/2019 (Medical): No    Lack of Transportation (Non-Medical): No  Physical Activity: Unknown (01/19/2019)   Exercise Vital Sign    Days of Exercise per Week: Not on file    Minutes of Exercise per Session: 30 min  Stress: No Stress Concern Present (01/19/2019)   03/20/2019 of Occupational Health - Occupational Stress Questionnaire    Feeling of Stress : Not at all  Social Connections: Unknown (04/22/2019)   Social Connection and Isolation Panel [NHANES]    Frequency of Communication with Friends and Family: Not on file    Frequency of Social Gatherings with Friends and Family: Twice a week    Attends Religious Services: 1 to 4 times per year    Active Member of 06/22/2019 or Organizations: Yes    Attends [employer] Meetings: Never  Marital Status: Living with partner  Intimate Partner Violence: Not At Risk (09/24/2018)   Humiliation, Afraid, Rape, and Kick questionnaire    Fear of Current or Ex-Partner: No    Emotionally Abused: No    Physically Abused: No    Sexually Abused: No    Current Outpatient Medications on File Prior to Visit  Medication Sig Dispense Refill   buPROPion (WELLBUTRIN XL) 150 MG 24 hr tablet Take 1 tablet (150 mg total) by mouth daily. 90 tablet 1   metFORMIN (GLUCOPHAGE) 500 MG tablet TAKE 1 TABLET BY MOUTH TWICE DAILY 60 tablet 5   norethindrone (MICRONOR) 0.35 MG tablet Take 1 tablet (0.35 mg total) by mouth daily. 84 tablet 3   topiramate (TOPAMAX) 25 MG tablet Take 1 tablet (25 mg total) by mouth 2 (two) times daily. 60 tablet 3   No  current facility-administered medications on file prior to visit.    Allergies  Allergen Reactions   Penicillins Rash     Review of Systems Constitutional: negative for chills, fatigue, fevers and sweats Eyes: negative for irritation, redness and visual disturbance Ears, nose, mouth, throat, and face: negative for hearing loss, nasal congestion, snoring and tinnitus Respiratory: negative for asthma, cough, sputum Cardiovascular: negative for chest pain, dyspnea, exertional chest pressure/discomfort, irregular heart beat, palpitations and syncope Gastrointestinal: negative for abdominal pain, change in bowel habits, nausea and vomiting Genitourinary: negative for abnormal menstrual periods, genital lesions, sexual problems and vaginal discharge, dysuria and urinary incontinence Integument/breast: negative for breast lump, breast tenderness and nipple discharge Hematologic/lymphatic: negative for bleeding and easy bruising Musculoskeletal:negative for back pain and muscle weakness Neurological: negative for dizziness, headaches, vertigo and weakness Endocrine: negative for diabetic symptoms including polydipsia, polyuria and skin dryness Allergic/Immunologic: negative for hay fever and urticaria      Objective:  There were no vitals taken for this visit. There is no height or weight on file to calculate BMI.    General Appearance:    Alert, cooperative, no distress, appears stated age  Head:    Normocephalic, without obvious abnormality, atraumatic  Eyes:    PERRL, conjunctiva/corneas clear, EOM's intact, both eyes  Ears:    Normal external ear canals, both ears  Nose:   Nares normal, septum midline, mucosa normal, no drainage or sinus tenderness  Throat:   Lips, mucosa, and tongue normal; teeth and gums normal  Neck:   Supple, symmetrical, trachea midline, no adenopathy; thyroid: no enlargement/tenderness/nodules; no carotid bruit or JVD  Back:     Symmetric, no curvature, ROM  normal, no CVA tenderness  Lungs:     Clear to auscultation bilaterally, respirations unlabored  Chest Wall:    No tenderness or deformity   Heart:    Regular rate and rhythm, S1 and S2 normal, no murmur, rub or gallop  Breast Exam:    No tenderness, masses, or nipple abnormality  Abdomen:     Soft, non-tender, bowel sounds active all four quadrants, no masses, no organomegaly.    Genitalia:    Pelvic:external genitalia normal, vagina without lesions, discharge, or tenderness, rectovaginal septum  normal. Cervix normal in appearance, no cervical motion tenderness, no adnexal masses or tenderness.  Uterus normal size, shape, mobile, regular contours, nontender.  Rectal:    Normal external sphincter.  No hemorrhoids appreciated. Internal exam not done.   Extremities:   Extremities normal, atraumatic, no cyanosis or edema  Pulses:   2+ and symmetric all extremities  Skin:   Skin color, texture, turgor normal, no  rashes or lesions  Lymph nodes:   Cervical, supraclavicular, and axillary nodes normal  Neurologic:   CNII-XII intact, normal strength, sensation and reflexes throughout   .  Labs:  Lab Results  Component Value Date   WBC 9.0 08/12/2021   HGB 13.5 08/12/2021   HCT 40.5 08/12/2021   MCV 76 (L) 08/12/2021   PLT 341 08/12/2021    Lab Results  Component Value Date   CREATININE 0.62 08/12/2021   BUN 15 08/12/2021   NA 142 08/12/2021   K 4.5 08/12/2021   CL 101 08/12/2021   CO2 25 08/12/2021    Lab Results  Component Value Date   ALT 28 08/12/2021   AST 19 08/12/2021   ALKPHOS 81 08/12/2021   BILITOT 0.2 08/12/2021    Lab Results  Component Value Date   TSH 1.790 08/12/2021     Assessment:   No diagnosis found.   Plan:  Blood tests: {blood tests:13147}. Breast self exam technique reviewed and patient encouraged to perform self-exam monthly. Contraception: OCP (estrogen/progesterone). Discussed healthy lifestyle modifications. Mammogram  : Not age  appropriate Pap smear  UTD . COVID vaccination status: Follow up in 1 year for annual exam   Hildred Laser, MD [facility]

## 2022-08-23 ENCOUNTER — Encounter: Payer: Self-pay | Admitting: Obstetrics and Gynecology

## 2022-08-23 ENCOUNTER — Ambulatory Visit (INDEPENDENT_AMBULATORY_CARE_PROVIDER_SITE_OTHER): Payer: Medicaid Other | Admitting: Obstetrics and Gynecology

## 2022-08-23 VITALS — BP 117/74 | HR 87 | Ht 63.0 in | Wt 206.3 lb

## 2022-08-23 DIAGNOSIS — E669 Obesity, unspecified: Secondary | ICD-10-CM

## 2022-08-23 DIAGNOSIS — Z8659 Personal history of other mental and behavioral disorders: Secondary | ICD-10-CM

## 2022-08-23 DIAGNOSIS — Z01419 Encounter for gynecological examination (general) (routine) without abnormal findings: Secondary | ICD-10-CM | POA: Diagnosis not present

## 2022-08-23 DIAGNOSIS — R7303 Prediabetes: Secondary | ICD-10-CM | POA: Diagnosis not present

## 2022-08-24 LAB — COMPREHENSIVE METABOLIC PANEL
ALT: 16 IU/L (ref 0–32)
AST: 11 IU/L (ref 0–40)
Albumin/Globulin Ratio: 1.7 (ref 1.2–2.2)
Albumin: 4.4 g/dL (ref 4.0–5.0)
Alkaline Phosphatase: 69 IU/L (ref 44–121)
BUN/Creatinine Ratio: 12 (ref 9–23)
BUN: 7 mg/dL (ref 6–20)
Bilirubin Total: 0.2 mg/dL (ref 0.0–1.2)
CO2: 23 mmol/L (ref 20–29)
Calcium: 9.4 mg/dL (ref 8.7–10.2)
Chloride: 102 mmol/L (ref 96–106)
Creatinine, Ser: 0.57 mg/dL (ref 0.57–1.00)
Globulin, Total: 2.6 g/dL (ref 1.5–4.5)
Glucose: 84 mg/dL (ref 70–99)
Potassium: 4.3 mmol/L (ref 3.5–5.2)
Sodium: 139 mmol/L (ref 134–144)
Total Protein: 7 g/dL (ref 6.0–8.5)
eGFR: 132 mL/min/{1.73_m2} (ref >59)

## 2022-08-24 LAB — CBC
Hematocrit: 38.8 % (ref 34.0–46.6)
Hemoglobin: 13.2 g/dL (ref 11.1–15.9)
MCH: 26.4 pg — ABNORMAL LOW (ref 26.6–33.0)
MCHC: 34 g/dL (ref 31.5–35.7)
MCV: 78 fL — ABNORMAL LOW (ref 79–97)
Platelets: 348 10*3/uL (ref 150–450)
RBC: 5 x10E6/uL (ref 3.77–5.28)
RDW: 13.5 % (ref 11.7–15.4)
WBC: 8 10*3/uL (ref 3.4–10.8)

## 2022-08-24 LAB — HEMOGLOBIN A1C
Est. average glucose Bld gHb Est-mCnc: 114 mg/dL
Hgb A1c MFr Bld: 5.6 % (ref 4.8–5.6)

## 2022-12-02 ENCOUNTER — Encounter: Payer: Self-pay | Admitting: Obstetrics and Gynecology
